# Patient Record
Sex: Female | Born: 1970 | Hispanic: No | Marital: Married | Smoking: Current every day smoker
Health system: Southern US, Community
[De-identification: ages and names within clinical notes are randomized; demographics above are authoritative.]

## PROBLEM LIST (undated history)

## (undated) DIAGNOSIS — G43909 Migraine, unspecified, not intractable, without status migrainosus: Secondary | ICD-10-CM

## (undated) DIAGNOSIS — M542 Cervicalgia: Secondary | ICD-10-CM

## (undated) DIAGNOSIS — I10 Essential (primary) hypertension: Secondary | ICD-10-CM

## (undated) DIAGNOSIS — F411 Generalized anxiety disorder: Secondary | ICD-10-CM

## (undated) HISTORY — DX: Essential (primary) hypertension: I10

## (undated) HISTORY — DX: Migraine, unspecified, not intractable, without status migrainosus: G43.909

## (undated) HISTORY — DX: Cervicalgia: M54.2

## (undated) HISTORY — DX: Generalized anxiety disorder: F41.1

---

## 2008-12-22 ENCOUNTER — Encounter: Payer: Self-pay | Admitting: Family

## 2009-02-20 ENCOUNTER — Encounter: Payer: Self-pay | Admitting: Family

## 2009-02-26 ENCOUNTER — Encounter: Payer: Self-pay | Admitting: Family

## 2009-02-27 ENCOUNTER — Encounter: Payer: Self-pay | Admitting: Family

## 2009-02-28 ENCOUNTER — Encounter: Payer: Self-pay | Admitting: Family

## 2009-03-04 ENCOUNTER — Encounter: Payer: Self-pay | Admitting: Family

## 2009-03-28 ENCOUNTER — Encounter: Payer: Self-pay | Admitting: Family Medicine

## 2009-04-04 ENCOUNTER — Encounter: Payer: Self-pay | Admitting: Family Medicine

## 2009-04-04 ENCOUNTER — Encounter: Payer: Self-pay | Admitting: Family

## 2009-04-08 ENCOUNTER — Encounter: Payer: Self-pay | Admitting: Family

## 2009-04-30 ENCOUNTER — Ambulatory Visit: Payer: Self-pay | Admitting: Family Medicine

## 2009-04-30 DIAGNOSIS — I10 Essential (primary) hypertension: Secondary | ICD-10-CM | POA: Insufficient documentation

## 2009-04-30 DIAGNOSIS — R1084 Generalized abdominal pain: Secondary | ICD-10-CM | POA: Insufficient documentation

## 2009-04-30 DIAGNOSIS — R002 Palpitations: Secondary | ICD-10-CM

## 2009-04-30 DIAGNOSIS — G43909 Migraine, unspecified, not intractable, without status migrainosus: Secondary | ICD-10-CM | POA: Insufficient documentation

## 2009-04-30 DIAGNOSIS — F411 Generalized anxiety disorder: Secondary | ICD-10-CM | POA: Insufficient documentation

## 2009-05-01 ENCOUNTER — Telehealth: Payer: Self-pay | Admitting: Family Medicine

## 2009-05-03 ENCOUNTER — Encounter: Payer: Self-pay | Admitting: Family Medicine

## 2009-05-07 ENCOUNTER — Telehealth: Payer: Self-pay | Admitting: Family Medicine

## 2009-05-15 ENCOUNTER — Ambulatory Visit: Payer: Self-pay | Admitting: Family Medicine

## 2009-05-15 DIAGNOSIS — M542 Cervicalgia: Secondary | ICD-10-CM

## 2009-05-17 ENCOUNTER — Telehealth: Payer: Self-pay | Admitting: Family Medicine

## 2009-05-20 ENCOUNTER — Encounter: Payer: Self-pay | Admitting: Family

## 2009-05-27 ENCOUNTER — Telehealth: Payer: Self-pay | Admitting: Family Medicine

## 2009-06-12 ENCOUNTER — Encounter: Payer: Self-pay | Admitting: Cardiology

## 2009-06-12 ENCOUNTER — Ambulatory Visit: Payer: Self-pay | Admitting: Cardiology

## 2009-06-12 DIAGNOSIS — R079 Chest pain, unspecified: Secondary | ICD-10-CM

## 2009-07-02 ENCOUNTER — Ambulatory Visit: Payer: Self-pay | Admitting: Diagnostic Radiology

## 2009-07-02 ENCOUNTER — Ambulatory Visit (HOSPITAL_COMMUNITY): Admission: RE | Admit: 2009-07-02 | Discharge: 2009-07-02 | Payer: Self-pay | Admitting: Cardiology

## 2009-07-02 ENCOUNTER — Emergency Department (HOSPITAL_BASED_OUTPATIENT_CLINIC_OR_DEPARTMENT_OTHER): Admission: EM | Admit: 2009-07-02 | Discharge: 2009-07-02 | Payer: Self-pay | Admitting: Emergency Medicine

## 2009-07-02 ENCOUNTER — Encounter: Payer: Self-pay | Admitting: Cardiology

## 2009-07-02 ENCOUNTER — Ambulatory Visit: Payer: Self-pay | Admitting: Family

## 2009-07-02 ENCOUNTER — Ambulatory Visit: Payer: Self-pay | Admitting: Cardiology

## 2009-07-02 ENCOUNTER — Ambulatory Visit: Payer: Self-pay

## 2009-07-03 ENCOUNTER — Telehealth: Payer: Self-pay | Admitting: Cardiology

## 2009-07-12 ENCOUNTER — Telehealth: Payer: Self-pay | Admitting: Cardiology

## 2009-07-16 ENCOUNTER — Encounter: Payer: Self-pay | Admitting: Cardiology

## 2009-07-16 ENCOUNTER — Ambulatory Visit: Payer: Self-pay | Admitting: Cardiology

## 2009-07-24 ENCOUNTER — Ambulatory Visit: Payer: Self-pay | Admitting: Family

## 2009-07-24 DIAGNOSIS — R5383 Other fatigue: Secondary | ICD-10-CM

## 2009-07-24 DIAGNOSIS — R5381 Other malaise: Secondary | ICD-10-CM

## 2009-07-29 ENCOUNTER — Telehealth: Payer: Self-pay | Admitting: Family

## 2009-07-29 DIAGNOSIS — IMO0002 Reserved for concepts with insufficient information to code with codable children: Secondary | ICD-10-CM

## 2009-07-29 DIAGNOSIS — D259 Leiomyoma of uterus, unspecified: Secondary | ICD-10-CM | POA: Insufficient documentation

## 2009-08-06 ENCOUNTER — Encounter: Payer: Self-pay | Admitting: Family

## 2009-08-06 ENCOUNTER — Telehealth: Payer: Self-pay | Admitting: Cardiology

## 2009-08-07 ENCOUNTER — Ambulatory Visit: Payer: Self-pay | Admitting: Family

## 2009-08-07 LAB — CONVERTED CEMR LAB
Bilirubin Urine: NEGATIVE
Nitrite: NEGATIVE
Specific Gravity, Urine: <1.005
Trich, Wet Prep: NONE SEEN
Urobilinogen, UA: 0.2
WBC Urine, dipstick: NEGATIVE
Yeast Wet Prep HPF POC: NONE SEEN
pH: 5

## 2009-08-08 ENCOUNTER — Telehealth: Payer: Self-pay | Admitting: Family

## 2009-08-09 ENCOUNTER — Encounter: Payer: Self-pay | Admitting: Family

## 2009-08-12 ENCOUNTER — Telehealth: Payer: Self-pay | Admitting: Family

## 2009-10-13 ENCOUNTER — Emergency Department (HOSPITAL_BASED_OUTPATIENT_CLINIC_OR_DEPARTMENT_OTHER): Admission: EM | Admit: 2009-10-13 | Discharge: 2009-10-13 | Payer: Self-pay | Admitting: Emergency Medicine

## 2009-10-13 ENCOUNTER — Ambulatory Visit: Payer: Self-pay | Admitting: Diagnostic Radiology

## 2009-10-16 ENCOUNTER — Ambulatory Visit: Payer: Self-pay | Admitting: Cardiology

## 2009-10-16 ENCOUNTER — Encounter: Payer: Self-pay | Admitting: Cardiology

## 2009-10-21 ENCOUNTER — Telehealth: Payer: Self-pay | Admitting: Family

## 2009-10-21 ENCOUNTER — Ambulatory Visit: Payer: Self-pay | Admitting: Family

## 2009-10-21 ENCOUNTER — Ambulatory Visit (HOSPITAL_BASED_OUTPATIENT_CLINIC_OR_DEPARTMENT_OTHER): Admission: RE | Admit: 2009-10-21 | Discharge: 2009-10-21 | Payer: Self-pay | Admitting: Internal Medicine

## 2009-10-21 ENCOUNTER — Ambulatory Visit: Payer: Self-pay | Admitting: Interventional Radiology

## 2009-10-21 DIAGNOSIS — R1013 Epigastric pain: Secondary | ICD-10-CM | POA: Insufficient documentation

## 2009-10-21 LAB — CONVERTED CEMR LAB
Albumin: 4.7 g/dL (ref 3.5–5.2)
Alkaline Phosphatase: 35 units/L — ABNORMAL LOW (ref 39–117)
Basophils Absolute: 0 10*3/uL (ref 0.0–0.1)
Basophils Relative: 1 % (ref 0–1)
Beta hcg, urine, semiquantitative: NEGATIVE
Eosinophils Absolute: 0.1 10*3/uL (ref 0.0–0.7)
Eosinophils Relative: 1 % (ref 0–5)
HCT: 39.1 % (ref 36.0–46.0)
Indirect Bilirubin: 0.5 mg/dL (ref 0.0–0.9)
Lipase: 18 units/L (ref 0–75)
Lymphocytes Relative: 38 % (ref 12–46)
Lymphs Abs: 2.3 10*3/uL (ref 0.7–4.0)
MCHC: 34.8 g/dL (ref 30.0–36.0)
MCV: 87.7 fL (ref 78.0–100.0)
Monocytes Relative: 6 % (ref 3–12)
Neutro Abs: 3.2 10*3/uL (ref 1.7–7.7)
Neutrophils Relative %: 54 % (ref 43–77)
Platelets: 191 10*3/uL (ref 150–400)
Total Protein: 7.1 g/dL (ref 6.0–8.3)

## 2009-10-22 ENCOUNTER — Telehealth: Payer: Self-pay | Admitting: Family

## 2009-10-24 ENCOUNTER — Ambulatory Visit: Payer: Self-pay | Admitting: Family

## 2009-10-24 ENCOUNTER — Telehealth: Payer: Self-pay | Admitting: Family

## 2009-10-25 ENCOUNTER — Encounter: Payer: Self-pay | Admitting: Family

## 2009-11-02 HISTORY — PX: MYOMECTOMY: SHX85

## 2009-11-16 ENCOUNTER — Encounter: Payer: Self-pay | Admitting: Family

## 2009-11-17 ENCOUNTER — Encounter: Payer: Self-pay | Admitting: Family

## 2009-11-19 ENCOUNTER — Telehealth: Payer: Self-pay | Admitting: Family

## 2009-11-20 ENCOUNTER — Ambulatory Visit: Payer: Self-pay | Admitting: Family

## 2009-11-20 DIAGNOSIS — IMO0001 Reserved for inherently not codable concepts without codable children: Secondary | ICD-10-CM

## 2009-11-22 ENCOUNTER — Telehealth: Payer: Self-pay | Admitting: Family

## 2009-11-22 ENCOUNTER — Encounter: Payer: Self-pay | Admitting: Family

## 2009-11-25 ENCOUNTER — Telehealth: Payer: Self-pay | Admitting: Family

## 2010-01-16 ENCOUNTER — Ambulatory Visit: Payer: Self-pay | Admitting: Family Medicine

## 2010-03-04 NOTE — Assessment & Plan Note (Signed)
Summary: NOV HAs/ GERD   Vital Signs:  Patient profile:   40 year old female Height:      63 inches Weight:      129 pounds BMI:     22.93 O2 Sat:      100 % on Room air Temp:     98.3 degrees F oral Pulse rate:   73 / minute BP sitting:   119 / 76  (left arm) Cuff size:   regular  Vitals Entered By: Payton Spark CMA (April 30, 2009 2:50 PM)  O2 Flow:  Room air CC: New to est. C/o stomach pain x weeks- has had all normal labs and scans recently. Dizzy all the time x 2 months and facial pressure and neck/thraot pain x few days.    Primary Care Provider:  Seymour Bars DO  CC:  New to est. C/o stomach pain x weeks- has had all normal labs and scans recently. Dizzy all the time x 2 months and facial pressure and neck/thraot pain x few days. Marland Kitchen  History of Present Illness: 40 yo [place] Female presents for feeling sick starting Jan 24th.  She was started on Lisinopril for HTN.  She had tongue swelling from it.  She then had heart palpitations with dizziness and presyncope at work.  She went to the ED and was diagnosed with allergic reaction to the Lisinopril.  Her EKG was normal.  She was given benadryl and steroid shots.  She went back to her regular doctor at [facility].  She had her labs done in Feb but she is not sure about her results.    She was started on  a different BP medication and reports having another reaction.  She had CXR and CT head and eveything was 'normal'.  She was diagnosed iwth anxiety.  She was put on Alprazolam but it made her sleepy.  She doesn't feel like it has helped.    Her abdomen hurts.  She had a normal CT and her gyn diagnosed her with fibroids.  She was put on strong pain meds that make her tired.    She took abx in Feb for ? H. Pylori but her pain did not improve.  She took a PrevPak.   She has been having HAs with neck pain and dizziness.  She has sharp L sided HA.  She has photophobia and nausea with HAs.      Current Medications (verified): 1)   Alprazolam 0.5 Mg Tabs (Alprazolam) .... Take 1 Tab By Mouth Three Times A Day  As Needed Anxiety 2)  Lisinopril 10 Mg Tabs (Lisinopril) .... Take 1 Tab By Mouth Once Daily  Allergies (verified): No Known Drug Allergies  Past History:  Past Medical History: anxiety HTN gyn Dr Silva Bandy  Past Surgical History: none  Family History: mother and father died in the War no sibblings  Social History: Moved from [place] in 2001. 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  Review of Systems       no fevers/sweats/weakness, unexplained wt loss/gain, no change in vision, no difficulty hearing, ringing in ears, no hay fever/allergies, no CP/discomfort, no palpitations, no breast lump/nipple discharge, no cough/wheeze, no blood in stool, no N/V/D, no nocturia, no leaking urine, no unusual vag bleeding, no vaginal/penile discharge, no muscle/joint pain, no rash, no new/changing mole, no HA, no memory loss, no anxiety, no sleep problem, no depression, no unexplained lumps, no easy bruising/bleeding, no concern with sexual function   Physical Exam  General:  alert, well-developed, well-nourished, and well-hydrated.   Head:  normocephalic and atraumatic.   Eyes:  sclera non icteric Nose:  no nasal discharge.   Mouth:  pharynx pink and moist.   Neck:  no masses.   Lungs:  Normal respiratory effort, chest expands symmetrically. Lungs are clear to auscultation, no crackles or wheezes. Heart:  Normal rate and regular rhythm. S1 and S2 normal without gallop, murmur, click, rub or other extra sounds. Abdomen:  epigastric TTP with vol guarding.   soft.  ND. no HSM. Extremities:  no LE edema Skin:  color normal.   Cervical Nodes:  No lymphadenopathy noted Psych:  good eye contact and slightly anxious.     Impression & Recommendations:  Problem # 1:  ABDOMINAL PAIN, GENERALIZED (ICD-789.07) Will refer to GI.  Will get a copy of her labs and scans that were already done.  Her  complaints and history are a bit confusing without records and will a language barrier.  She will probably need an EGD since she has failed to improve after Prevpak.    Start Dexilant samples today as her symptoms are somewhat c/w reflux esophagitis.   Orders: Gastroenterology Referral (GI)  Problem # 2:  MIGRAINE HEADACHE (ICD-346.90) With neck pain.  It sounds like she has some stress and anxiety that are inducing HAs which are in turn causing neck pain.  I will start her on Inderal LA for probable migraines which will also help reduce her BP and maybe even help w/ her anxiety.   Her updated medication list for this problem includes:    Inderal La 60 Mg Xr24h-cap (Propranolol hcl) .Marland Kitchen... 1 capsule by mouth once a day  Problem # 3:  ESSENTIAL HYPERTENSION, BENIGN (ICD-401.1) Stop Lisinopril.  It sounds like she had an angioedema response with tongue swelling iniitally and has felt bad ever since starting it.  Will change her to Inderal LA for BP and migraines.   Her updated medication list for this problem includes:    Inderal La 60 Mg Xr24h-cap (Propranolol hcl) .Marland Kitchen... 1 capsule by mouth once a day  BP today: 119/76  Problem # 4:  ANXIETY STATE, UNSPECIFIED (ICD-300.00) Continue Lorazepam for now.   Her updated medication list for this problem includes:    Lorazepam 0.5 Mg Tabs (Lorazepam) .Marland Kitchen... Take 1 tab by mouth three times a day  Complete Medication List: 1)  Lorazepam 0.5 Mg Tabs (Lorazepam) .... Take 1 tab by mouth three times a day 2)  Inderal La 60 Mg Xr24h-cap (Propranolol hcl) .Marland Kitchen.. 1 capsule by mouth once a day  Other Orders: T-TSH ([id number])  Patient Instructions: 1)  Stay OFF Lisinopril.  Call me with the names of other BP meds that were tried. 2)  I will review your records - labs, scans, etc. 3)  Start on Dexilant samples - 1 capsule daily for acid reflux/ possible ulcers and we will get you into GI to look at your stomache. 4)  F/U with me for dizziness, HAs and BP  in 2 wks. Prescriptions: INDERAL LA 60 MG XR24H-CAP (PROPRANOLOL HCL) 1 capsule by mouth once a day  #30 x 1   Entered by:   Payton Spark CMA   Authorized by:   Seymour Bars DO   Signed by:   Payton Spark CMA on 05/01/2009   Method used:   Electronically to        [facility] # [phone]* (retail)       [address]       [place], [place]  [address]       Ph: [phone]       Fax: [phone]   RxID:   [id number] INDERAL LA 60 MG XR24H-CAP (PROPRANOLOL HCL) 1 capsule by mouth once a day  #30 x 1   Entered and Authorized by:   Seymour Bars DO   Signed by:   Seymour Bars DO on 04/30/2009   Method used:   Electronically to        [facility] [phone]* (retail)       [address] [place], [place]  [address]       Ph: [phone]       Fax: [phone]   RxID:   [id number]

## 2010-03-04 NOTE — Progress Notes (Signed)
  Phone Note Outgoing Call   Call placed by: Lemont Fillers FNP,  October 21, 2009 5:06 PM Call placed to: Patient Summary of Call: Spoke with patient and husband on phone.  Reviewed ultrasound and blood work.  They will check their records and get back with me in AM with the name of her gastroenterologist.  Initial call taken by: Lemont Fillers FNP,  October 21, 2009 5:09 PM  Follow-up for Phone Call        Have not heard back from patient.  Last rx for nexium according to her pharmacy was from  Dr. Dorna Leitz from [facility].  Left message for pt that we will arrange f/u with Dr. Octaviano Glow.   Follow-up by: Lemont Fillers FNP,  October 22, 2009 2:37 PM

## 2010-03-04 NOTE — Progress Notes (Signed)
Summary: Meds   Phone Note Call from Patient   Summary of Call: Pt states she tried these meds: Verapamil 120mg  Metoprolol 25mg   Nifedibine 30mg   She is now taking lorazepam 0.5mg  three times a day now, not alprazolam. (I changed this in her chart) Pt also would like to know if she can take new Rx at night?  Please advise. Initial call taken by: Payton Spark CMA,  May 01, 2009 10:15 AM  Follow-up for Phone Call        will be OK to take the Inderal.   Follow-up by: Seymour Bars DO,  May 01, 2009 4:45 PM     Appended Document: Meds  Pt aware

## 2010-03-04 NOTE — Assessment & Plan Note (Signed)
Summary: hosp f/u /tf,cma--Rm 4   Vital Signs:  Patient profile:   40 year old female Height:      63 inches Weight:      136.25 pounds BMI:     24.22 Temp:     97.7 degrees F oral Pulse rate:   84 / minute Pulse rhythm:   regular Resp:     22 per minute BP sitting:   132 / 90  (right arm) Cuff size:   regular  Vitals Entered By: Mervin Kung CMA Duncan Dull) (November 20, 2009 3:50 PM) CC: Rm 4  Hospital f/u. Is Patient Diabetic? No Comments Pt has completed Clarithromycin. Pt has added Tylenol otc 1 four times a day. Nicki Guadalajara Fergerson CMA Duncan Dull)  November 20, 2009 3:55 PM    Primary Care Provider:  Lemont Fillers FNP  CC:  [facility] f/u.Marland Kitchen  History of Present Illness: Ms Arlan Organ is a  40 year old female who presents for follow up. She was evaluated at [facility] on 10/15 and admitted overnight for atypical chest pain.  Records are available today for review.  Discharge summary notes that pain was felt likely noncardiac and probably musculoskeletal.  In addition to normal cardiac markers, she also underwent a CT of the brain/sinus/abdomen and pelvis which were all unremarkable. She had a normal chest x-ray.  She is scheduled for a stress test.  Today she reports ongoing  HA/dizziness, tigntness in her throat, sense of SOB, pain beneath her ribs.  Had fibroid surgery on 10/6.    Patient has history of multiple somatic complaints including HA, generalized abdominal pain, muscle pain, palpitations.  See previous notes for additional information.   Allergies: 1)  ! Inderal 2)  ! * Nifedipine 3)  ! Lisinopril 4)  ! Hydrochlorothiazide 5)  ! Propranolol Hcl 6)  ! Metoprolol Succinate 7)  ! Verapamil 8)  ! * Topiramate 9)  ! * Metronidazole 10)  ! * Tetracycline 11)  ! * Amlodipine 12)  ! Prilosec (Omeprazole) 13)  ! Amoxicillin 14)  ! Hydrocodone 15)  ! Levaquin 16)  ! * Vicoprofen 17)  ! * Omeprazole  Past History:  Past Medical History: Reviewed history from  06/12/2009 and no changes required. ESSENTIAL HYPERTENSION, BENIGN (ICD-401.1) CERVICALGIA (ICD-723.1) ANXIETY STATE, UNSPECIFIED (ICD-300.00) MIGRAINE HEADACHE (ICD-346.90) gyn Dr Silva Bandy  Past Surgical History: myomectomy 10/11  Review of Systems       see HPI  Physical Exam  General:  White female, awake, alert, uncomfortable appearing, in NAD.  Appears older than stated age. Head:  Normocephalic and atraumatic without obvious abnormalities. No apparent alopecia or balding. Neck:  No deformities, masses, or tenderness noted. Lungs:  Normal respiratory effort, chest expands symmetrically. Lungs are clear to auscultation, no crackles or wheezes. Heart:  Normal rate and regular rhythm. S1 and S2 normal without gallop, murmur, click, rub or other extra sounds. Abdomen:  Lower abdominal incision well approximated without drainage or erythema.  Mild lower abdominal tenderness to palpation.   Psych:  Oriented X3, memory intact for recent and remote, and normally interactive.  Flat affect, appears mildly anxious   Impression & Recommendations:  Problem # 1:  ANXIETY STATE, UNSPECIFIED (ICD-300.00) Assessment Comment Only 40 year old female with multiple somatic complaints.  She has previously undergone tenderpoint testing on previous visit and was tender at each site.  I have discussed that her symptoms are most likely due to fibromyalgia along with some anxiety and maybe depression.  She has  had a very extensive work up including GI evaluation which included endoscopy which was reportedly negative except for finding of H. Pylori.  Will try to get these records.  Will give patient a trial of cymbalta to hopefully help with her fibromyalgia as well as anxiety/depression.  I am not certain that she will stick with the medication, however as she his history of multiple drug intolerances in the past.  I discussed the common side effects of cymbalta including risk or worseing depression/suicide  ideation.  Should she develop any worsening depression she should discontinue the medication and call us immediately or go to the ED. She verbalizes understanding. 25 minutes were spent with the patient and her husband.  Greater tha 50% of this time was spent counseling the patient on her anxiety/depression. Her updated medication list for this problem includes:    Lorazepam 0.5 Mg Tabs (Lorazepam) .Marland Kitchen... Take 1 tablet by mouth four times a day    Cymbalta 60 Mg Cpep (Duloxetine hcl) ..... One tablet by mouth once daily  Problem # 2:  FIBROMYALGIA (ICD-729.1) Assessment: Unchanged See #1 for discussion Her updated medication list for this problem includes:    Tylenol Extra Strength 500 Mg Tabs (Acetaminophen) .Marland Kitchen... 1 tablet four times daily  Complete Medication List: 1)  Lorazepam 0.5 Mg Tabs (Lorazepam) .... Take 1 tablet by mouth four times a day 2)  Nexium 40 Mg Pack (Esomeprazole magnesium) .... One tablet by mouth daily 3)  Clarithromycin 500 Mg Tabs (Clarithromycin) .... One tablet by mouth two times a day x 10 days 4)  Tylenol Extra Strength 500 Mg Tabs (Acetaminophen) .Marland Kitchen.. 1 tablet four times daily 5)  Cymbalta 60 Mg Cpep (Duloxetine hcl) .... One tablet by mouth once daily  Patient Instructions: 1)  Please start cymbalta 30mg  once daily for 1 week, then increase to 60mg  by mouth once daily as tolerated. 2)  Please schedule a follow-up appointment in 1 month.  Prescriptions: CYMBALTA 60 MG CPEP (DULOXETINE HCL) one tablet by mouth once daily  #30 x 0   Entered and Authorized by:   Lemont Fillers FNP   Signed by:   Lemont Fillers FNP on 11/20/2009   Method used:   Print then Give to Patient   RxID:   [id number]    Orders Added: 1)  Est. Patient Level IV [10175]     Current Allergies (reviewed today): ! INDERAL ! * NIFEDIPINE ! LISINOPRIL ! HYDROCHLOROTHIAZIDE ! PROPRANOLOL HCL ! METOPROLOL SUCCINATE ! VERAPAMIL ! * TOPIRAMATE ! * METRONIDAZOLE ! *  TETRACYCLINE ! * AMLODIPINE ! PRILOSEC (OMEPRAZOLE) ! AMOXICILLIN ! HYDROCODONE ! LEVAQUIN ! * VICOPROFEN ! * OMEPRAZOLE

## 2010-03-04 NOTE — Progress Notes (Signed)
Summary: Inderal SEs  Phone Note Call from Patient   Caller: Patient Summary of Call: Pt LMOM stating the Inderal is cauing HAs, worse dizziness and nausea. She wants to know if she should continue med? Initial call taken by: Payton Spark CMA,  May 07, 2009 2:11 PM  Follow-up for Phone Call        stop the inderal.  Set up OV next wk for f/u HAs (no sooner than a wk).   Follow-up by: Seymour Bars DO,  May 07, 2009 2:20 PM   New Allergies: ! INDERAL New Allergies: ! INDERAL  Appended Document: Inderal SEs LMOM for Pt to CB.Arvilla Market CMA, Michelle May 07, 2009 2:31 PM   Pt aware.Arvilla Market CMA, Michelle May 07, 2009 3:31 PM

## 2010-03-04 NOTE — Letter (Signed)
Summary: [facility]   Imported By: Lanelle Bal 08/19/2009 10:46:03  _____________________________________________________________________  External Attachment:    Type:   Image     Comment:   External Document

## 2010-03-04 NOTE — Assessment & Plan Note (Signed)
Summary: 1 month follow up/mjhf   Vital Signs:  Patient profile:   40 year old female Height:      63 inches Weight:      139 pounds BMI:     24.71 O2 Sat:      99 % on Room air Temp:     98.5 degrees F oral Pulse rate:   80 / minute Pulse rhythm:   regular Resp:     16 per minute BP sitting:   114 / 80  (right arm) Cuff size:   regular  Vitals Entered By: Glendell Docker CMA (August 07, 2009 10:33 AM)  O2 Flow:  Room air CC: Rm 5 1 Month Follow up  Comments c/o chest pain with deep breathing, lower abdomen discomfort unresolved   Primary Care Provider:  Seymour Bars DO  CC:  Rm 5 1 Month Follow up .  History of Present Illness: Natalie Cooley is a 40 year old female who presents today with complaint of muscle pain.  She also continues to complain of pelvic pain.  She provided [place] with records from her GYN which indicate that she has had negative imaging of her pelvis and abdomen this past spring.  She also complains of some vaginal itching and pleuritic chest pain.  Her chest pain has been evaluted by cardiology with negative work up.  She has also undergone extensive GI evaluation which has included treatment for H Pylori (for which patient has been intolerant to antibiotic treatment).    Preventive Screening-Counseling & Management  Alcohol-Tobacco     Smoking Status: current  Allergies: 1)  ! Inderal 2)  ! * Nifedipine 3)  ! Lisinopril 4)  ! Hydrochlorothiazide 5)  ! Propranolol Hcl 6)  ! Metoprolol Succinate 7)  ! Verapamil 8)  ! * Topiramate 9)  ! * Metronidazole 10)  ! * Tetracycline 11)  ! * Amlodipine 12)  ! Prilosec (Omeprazole)  Past History:  Past Medical History: Last updated: Jun 26, 2009 ESSENTIAL HYPERTENSION, BENIGN (ICD-401.1) CERVICALGIA (ICD-723.1) ANXIETY STATE, UNSPECIFIED (ICD-300.00) MIGRAINE HEADACHE (ICD-346.90) gyn Dr Silva Bandy  Past Surgical History: Last updated: 04/30/2009 none  Family History: Last updated: Jun 26, 2009 mother and  father died in the War no sibblings No premature coronary artery disease  Social History: Last updated: 06-26-2009 Moved from [place] in 2001. 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 Tobacco Use - Yes.  Alcohol Use - no Drug Use - no  Risk Factors: Smoking Status: current (08/07/2009)  Physical Exam  General:  Well-developed,well-nourished,in no acute distress; alert,appropriate and cooperative throughout examination Head:  Normocephalic and atraumatic without obvious abnormalities. No apparent alopecia or balding. Neck:  No deformities, masses, or tenderness noted. Lungs:  Normal respiratory effort, chest expands symmetrically. Lungs are clear to auscultation, no crackles or wheezes. Heart:  Normal rate and regular rhythm. S1 and S2 normal without gallop, murmur, click, rub or other extra sounds. Genitalia:  Pelvic Exam:        External: normal female genitalia without lesions or masses        Vagina: normal without lesions or masses        Cervix: normal without lesions or masses        Adnexa: normal bimanual exam without masses or fullness.  Some bilateral adnexal tenderness to palpation        Uterus: normal by palpation        Pap smear: performed Psych:  Oriented X3 and slightly anxious.     Impression & Recommendations:  Problem # 1:  MYALGIA (ICD-729.1) Assessment Unchanged Will check below labs.  If negative, will plan to initiate savella for treatment of fibromyalgia given that patient was tender at all tender points last exam.   Orders: T-ANA ([id number]) T-Rheumatoid Factor [phone]) T-ESR [facility]) [phone])  Problem # 2:  BACTERIAL VAGINITIS (ICD-616.10) Assessment: New Wet prep- + for BV.  Will treat with cleocin cream as pt is allergic to metronidazole. Gc/Chlamydia also sent due to patients c/o of pelvic pain.   Her updated medication list for this problem includes:    Cleocin 2 % Crea (Clindamycin phosphate) .Marland KitchenMarland KitchenMarland KitchenMarland Kitchen 5 grams  intravaginally at bedtime x 7 days  Complete Medication List: 1)  Lorazepam 0.5 Mg Tabs (Lorazepam) .... Take 1 tablet by mouth four times a day 2)  Nexium 40 Mg Cpdr (Esomeprazole magnesium) .... One cap by mouth daily 3)  Diazepam 5 Mg Tabs (Diazepam) .... 1/2 tablet by mouth two times a day as needed for severe anxiety 4)  Cleocin 2 % Crea (Clindamycin phosphate) .... 5 grams intravaginally at bedtime x 7 days  Other Orders: UA Dipstick w/o Micro (manual) ([id number]) T-Culture, GC Screen ([id number]) T-Wet Prep by Molecular Probe [phone]) Specimen Handling ([id number])  Patient Instructions: 1)  Please complete the lab work downstairs. 2)  Call if symptoms worsen or do not improve.    Current Allergies (reviewed today): ! INDERAL ! * NIFEDIPINE ! LISINOPRIL ! HYDROCHLOROTHIAZIDE ! PROPRANOLOL HCL ! METOPROLOL SUCCINATE ! VERAPAMIL ! * TOPIRAMATE ! * METRONIDAZOLE ! * TETRACYCLINE ! * AMLODIPINE ! PRILOSEC (OMEPRAZOLE) Laboratory Results   Urine Tests    Routine Urinalysis   Color: yellow Appearance: Clear Glucose: negative   (Normal Range: Negative) Bilirubin: negative   (Normal Range: Negative) Ketone: negative   (Normal Range: Negative) Spec. Gravity: <1.005   (Normal Range: 1.003-1.035) Blood: negative   (Normal Range: Negative) pH: 5.0   (Normal Range: 5.0-8.0) Protein: negative   (Normal Range: Negative) Urobilinogen: 0.2   (Normal Range: 0-1) Nitrite: negative   (Normal Range: Negative) Leukocyte Esterace: negative   (Normal Range: Negative)

## 2010-03-04 NOTE — Letter (Signed)
Summary: [facility]   Imported By: Lanelle Bal 08/19/2009 10:49:12  _____________________________________________________________________  External Attachment:    Type:   Image     Comment:   External Document

## 2010-03-04 NOTE — Assessment & Plan Note (Signed)
Summary: pt requesting u/s for pain under ribs/dt--rm 4   Vital Signs:  Patient profile:   40 year old female Height:      63 inches Weight:      138.25 pounds BMI:     24.58 Temp:     97.9 degrees F oral Pulse rate:   78 / minute Pulse rhythm:   regular Resp:     12 per minute BP sitting:   132 / 80  (right arm) Cuff size:   regular  Vitals Entered By: Mervin Kung CMA Duncan Dull) (October 21, 2009 11:15 AM) CC: Rm 4  Abdominal pain since Friday. Had Diarrhea Friday and Saturday. Wants gallbladder and sinuses checked. Is Patient Diabetic? No Pain Assessment Patient in pain? yes     Location: abdomen Intensity: 8 Type: sharp, burning Onset of pain  last Friday   Primary Care Provider:  Seymour Bars DO  CC:  Rm 4  Abdominal pain since Friday. Had Diarrhea Friday and Saturday. Wants gallbladder and sinuses checked.Marland Kitchen  History of Present Illness: Ms Gajda is a 40 year old female who presents with c/o of epigastric pain x 2 weeks.  Pain is worse after eating- and was associated with diarrhea.  Notes that she had diarrhea on friday and saturday followed by constipation the last 2 days.  Stools have been dark in color yesterday (normal brown color today)- no BRBPR.  Denies nausa or vomitting.  Also has complaint of "sour taste in her throat." She is accompanied by her husband.  Sinuses notes that she has chronic frontal sinus pain.  Notes + associated sinus tenderness.  Some post nasal drip. Denies fever.    Muscle pain- unchanged.  She never started the [place], and is nervous about starting this medication at this time due to the side effects.  Allergies: 1)  ! Inderal 2)  ! * Nifedipine 3)  ! Lisinopril 4)  ! Hydrochlorothiazide 5)  ! Propranolol Hcl 6)  ! Metoprolol Succinate 7)  ! Verapamil 8)  ! * Topiramate 9)  ! * Metronidazole 10)  ! * Tetracycline 11)  ! * Amlodipine 12)  ! Prilosec (Omeprazole) 13)  ! Amoxicillin 14)  ! Hydrocodone 15)  ! Levaquin 16)  !  * Vicoprofen 17)  ! * Omeprazole  Physical Exam  General:  Tired appearing female, awake, alert and in NAD Head:  Normocephalic and atraumatic without obvious abnormalities. No apparent alopecia or balding. + maxillary tenderness to palpation. Ears:  External ear exam shows no significant lesions or deformities.  Otoscopic examination reveals clear canals, tympanic membranes are intact bilaterally without bulging, retraction, inflammation or discharge. Hearing is grossly normal bilaterally. Mouth:  Oral mucosa and oropharynx without lesions or exudates.  Teeth in good repair. Neck:  No deformities, masses, or tenderness noted. Lungs:  Normal respiratory effort, chest expands symmetrically. Lungs are clear to auscultation, no crackles or wheezes. Heart:  Normal rate and regular rhythm. S1 and S2 normal without gallop, murmur, click, rub or other extra sounds. Abdomen:  + epigastric tenderness to palpation.  +RUQ/LUQ tenderness to palpation-  + BS, abdomen is soft and non-distended.     Impression & Recommendations:  Problem # 1:  EPIGASTRIC PAIN (ICD-789.06) Assessment New Differential includes gastroenteritis, ulcer, pancreatitis, cholecystitis.  Patient will be returned to nexium based on her complaints of "sour taste" in throat which is consistent with GERD.  She was also instructed to discontinue use of NSAIDS, and substitute as needed tylenol instead. Ordered CT abdomen  and pelvis, however her insurer denies- recommended ultrasound first.   Will obtain ultrasound and obtain below listed labs. Orders: TLB-CBC Platelet - w/Differential (85025-[id number]) TLB-Hepatic/Liver Function Pnl ([id number]-[id number]) T-Lipase ([id number]) T-Amylase ([id number]) Misc. Referral (Misc. Ref) Ultrasound (Ultrasound)  Problem # 2:  SINUSITIS (ICD-473.9) Assessment: New Will plan to treat with doxyclycline.   Pt has multiple drug intolerances- choices are limited.  Her updated medication list for this problem  includes:    Doxycycline Hyclate 100 Mg Caps (Doxycycline hyclate) ..... One cap by mouth two times a day x 10 days  Problem # 3:  MYALGIA (ICD-729.1) Assessment: Unchanged Pt will consider trial of Savella.  I am suspiscous that she has fibromyalgia.  The following medications were removed from the medication list:    Ibuprofen 800 Mg Tabs (Ibuprofen) .Marland Kitchen... Take 1 tablet by mouth three times a day  Complete Medication List: 1)  Lorazepam 0.5 Mg Tabs (Lorazepam) .... Take 1 tablet by mouth four times a day 2)  Nexium 40 Mg Pack (Esomeprazole magnesium) .... One tablet by mouth daily 3)  Doxycycline Hyclate 100 Mg Caps (Doxycycline hyclate) .... One cap by mouth two times a day x 10 days  Other Orders: Urine Pregnancy Test  ([id number])   Patient Instructions: 1)  Please complete the blood work and CT scan downstairs today. 2)  Follow up in 1 week. 3)  Go to ER if you develop worsening abdominal pain. 4)  Follow up here in 1 weeks. Prescriptions: DOXYCYCLINE HYCLATE 100 MG CAPS (DOXYCYCLINE HYCLATE) one cap by mouth two times a day x 10 days  #20 x 0   Entered and Authorized by:   Lemont Fillers FNP   Signed by:   Lemont Fillers FNP on 10/21/2009   Method used:   Electronically to        [facility] # [phone]* (retail)       [address]       [place], [place]  [address]       Ph: [phone]       Fax: [phone]   RxID:   [id number] NEXIUM 40 MG PACK (ESOMEPRAZOLE MAGNESIUM) one tablet by mouth daily Brand medically necessary #30 x 5   Entered and Authorized by:   Lemont Fillers FNP   Signed by:   Lemont Fillers FNP on 10/21/2009   Method used:   Electronically to        [facility] # [phone]* (retail)       [address]       [place], [place]  [address]       Ph: [phone]       Fax: [phone]   RxID:   [id number]   Current Allergies (reviewed today): ! INDERAL ! *  NIFEDIPINE ! LISINOPRIL ! HYDROCHLOROTHIAZIDE ! PROPRANOLOL HCL ! METOPROLOL SUCCINATE ! VERAPAMIL ! * TOPIRAMATE ! * METRONIDAZOLE ! * TETRACYCLINE ! * AMLODIPINE ! PRILOSEC (OMEPRAZOLE) ! AMOXICILLIN ! HYDROCODONE ! LEVAQUIN ! * VICOPROFEN ! * OMEPRAZOLE  Laboratory Results   Urine Tests      Urine HCG: negative

## 2010-03-04 NOTE — Progress Notes (Signed)
----   Converted from flag ---- ---- 11/25/2009 9:28 AM, Mervin Kung CMA (AAMA) wrote: Sherron Monday to [place] @ [phone] and she states records will be faxed tomorrow.  ---- 11/25/2009 9:07 AM, Lemont Fillers FNP wrote: Could you pls call [facility] and request patient's EGD results? ------------------------------

## 2010-03-04 NOTE — Letter (Signed)
Summary: [facility]   Imported By: Lanelle Bal 12/09/2009 09:39:38  _____________________________________________________________________  External Attachment:    Type:   Image     Comment:   External Document
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
## 2010-03-04 NOTE — Progress Notes (Signed)
Summary: fibromyalgia med delay  Phone Note Outgoing Call   Summary of Call: Please call patient and let her know that her lab work is negative.  I would like fro her to try Savella for Fibromyalgia- I will leave rx for the 2 week titration pack.  She should take this as directed, then start 50mg  by mouth two times a day when she completes the titration pack.  I have sent the 50mg  tabs to her pharmacy.  She should follow up in 1 month. Initial call taken by: Lemont Fillers FNP,  August 12, 2009 8:18 AM  Follow-up for Phone Call        Notified pt per [facility] instructions. Pt states she is leaving in 2 days to return to [place] and will be returning on 09/12/09. Pt will pick up sample today. She states she will start sample when she returns as she is concerned about history of medication reactions.  Nicki Guadalajara Fergerson CMA (AAMA)  August 12, 2009 12:10 PM     New/Updated Medications: SAVELLA 50 MG TABS (MILNACIPRAN HCL) one tab by mouth two times a day Prescriptions: SAVELLA 50 MG TABS (MILNACIPRAN HCL) one tab by mouth two times a day  #60 x 1   Entered and Authorized by:   Lemont Fillers FNP   Signed by:   Lemont Fillers FNP on 08/12/2009   Method used:   Electronically to        [facility] # [phone]* (retail)       [address]       [place], [place]  [address]       Ph: [phone]       Fax: [phone]   RxID:   [id number]

## 2010-03-04 NOTE — Progress Notes (Signed)
Summary: Card referral  Phone Note Call from Patient   Caller: Patient Summary of Call: Pt called stating she is ready to see a cardiologist and would like referral to be made.  Initial call taken by: Payton Spark CMA,  May 27, 2009 11:41 AM  Follow-up for Phone Call        done. Follow-up by: Seymour Bars DO,  May 27, 2009 11:58 AM

## 2010-03-04 NOTE — [facility] (Signed)
[facility]   Im[facility] By: Lanelle Bal 08/19/2009 10:48:00  _____________________________________________________________________  External Attachment:    Type:   Image     Comment:   External Document

## 2010-03-04 NOTE — Progress Notes (Signed)
Summary: SE HCTZ  Phone Note Call from Patient   Caller: Patient Summary of Call: Pt states she started the HCTZ yesterday and now she is having increased dizziness and HA. She also c/o elevated BP. Please advise. Initial call taken by: Payton Spark CMA,  May 17, 2009 1:48 PM  Follow-up for Phone Call        This is a common SE from the medication.  Start with 1/2 tab daily until body gets used to lower BPs and SEs improve.  Do not stop it.   Follow-up by: Seymour Bars DO,  May 17, 2009 1:59 PM  Additional Follow-up for Phone Call Additional follow up Details #1::        Pt states she doesn't want to continue med bc it is raising her blood pressure. BP today after med 153/90. She also states that it makes her feel like her throat is "squeezing". Please advise. Additional Follow-up by: Payton Spark CMA,  May 17, 2009 2:03 PM    Additional Follow-up for Phone Call Additional follow up Details #2::    she is out of options. If she is not going to comply with treatment, offer her to see cardiology. Follow-up by: Seymour Bars DO,  May 17, 2009 2:07 PM   Appended Document: SE HCTZ Pt states she doesn't know what she needs and doesn't understand what is going on w/ her. Pt states she would like to continue to check her BP from home for 1 week and she will CB w/ readings and also to let you know if she wants to see cards.

## 2010-03-04 NOTE — Assessment & Plan Note (Signed)
Summary: [facility]   Visit Type:  Follow-up Primary Provider:  Seymour Bars DO  CC:  chest painS.  History of Present Illness: 40 yo female that I have seen in the past for chest pain, palpitations and hypertension. The patient is from [place]. She apparently was diagnosed with hypertension in fall of 2010l. She has been tried on multiple medications. She has been intolerant to Inderal, nifedipine, lisinopril, HCTZ, propranolol, metoprolol, verapamil. She states most of these medications caused a headache. Lisinopril caused tongue swelling. An echocardiogram in May of 2011 showed normal LV function. A Holter monitor revealed sinus rhythm with occasional PACs. There appeared to be significant artifact in some of the monitor. When I saw her previously and we added Toprol but this apparently caused shortness of breath. Instead Norvasc was added. Since then she discontinued her Norvasc after 2 doses because she stated it caused headache, dizziness and extreme fatigue. She was seen in the emergency room on Jul 02, 2009 with chest pain. The pain had been continuous for 4 days and increased with certain movements and palpation. Chest x-ray showed no infiltrate. A CBC, met, d-dimer and one set of cardiac markers were negative. I last saw her in July of 2011. Since then, she has multiple complaints. She continues to have vague chest pain that is continuous without ever completely resolving. She continues to have left upper and pain. There is no dyspnea. She continues to have occasional palpitations. They're not sustained. She also apparently has been diagnosed with fibroids which will require surgery.  Allergies: 1)  ! Inderal 2)  ! * Nifedipine 3)  ! Lisinopril 4)  ! Hydrochlorothiazide 5)  ! Propranolol Hcl 6)  ! Metoprolol Succinate 7)  ! Verapamil 8)  ! * Topiramate 9)  ! * Metronidazole 10)  ! * Tetracycline 11)  ! * Amlodipine 12)  ! Prilosec (Omeprazole) 13)  ! Amoxicillin 14)  !  Hydrocodone 15)  ! Levaquin 16)  ! * Vicoprofen 17)  ! * Omeprazole  Past History:  Past Medical History: Reviewed history from 06/12/2009 and no changes required. ESSENTIAL HYPERTENSION, BENIGN (ICD-401.1) CERVICALGIA (ICD-723.1) ANXIETY STATE, UNSPECIFIED (ICD-300.00) MIGRAINE HEADACHE (ICD-346.90) gyn Dr Silva Bandy  Past Surgical History: Reviewed history from 04/30/2009 and no changes required. none  Social History: Reviewed history from 06/12/2009 and no changes required. Moved from [place] in 2001. 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 Tobacco Use - Yes.  Alcohol Use - no Drug Use - no  Review of Systems       Abdominal pain and fibroids but no fevers or chills, productive cough, hemoptysis, dysphasia, odynophagia, melena, hematochezia, dysuria, hematuria, rash, seizure activity, orthopnea, PND, pedal edema, claudication. Remaining systems are negative.   Vital Signs:  Patient profile:   40 year old female Height:      63 inches Weight:      137 pounds BMI:     24.36 Pulse rate:   75 / minute Pulse rhythm:   regular Resp:     18 per minute BP sitting:   124 / 94  (right arm) Cuff size:   large  Vitals Entered By: Vikki Ports (October 16, 2009 1:47 PM)  Physical Exam  General:  Well-developed well-nourished in no acute distress.  Skin is warm and dry.  HEENT is normal.  Neck is supple. No thyromegaly.  Chest is clear to auscultation with normal expansion.  Cardiovascular exam is regular rate and rhythm.  Abdominal exam nontender or distended. No masses  palpated. Extremities show no edema. neuro grossly intact    EKG  Procedure date:  10/16/2009  Findings:      Normal sinus rhythm at a rate of 69. Incomplete right bundle branch block. No ST changes.  Impression & Recommendations:  Problem # 1:  CHEST PAIN (ICD-786.50) Symptoms atypical and not consistent with cardiac pain. No further indicated.  Problem # 2:  ESSENTIAL  HYPERTENSION, BENIGN (ICD-401.1) I reviewed her blood pressures from home readings. Her systolic typically is in the 120 to 130 range. Her diastolic is typically 85. We will not add additional medications as she has not tolerated multiple medications previously and her blood pressure appears to be reasonably well controlled on lifestyle modification only.  Problem # 3:  ANXIETY STATE, UNSPECIFIED (ICD-300.00)

## 2010-03-04 NOTE — Progress Notes (Signed)
Summary: med reaction; lab order  Phone Note Outgoing Call   Summary of Call: Pls contact patient and let her know that I have reviewed her medical records.  I would like to have her return for the following labs which have not yet been checked:  B12, folate, ESR, RA, ANA (ICD-9 729.1)  Initial call taken by: Lemont Fillers FNP,  July 29, 2009 8:18 AM  Follow-up for Phone Call        Left message on machine to return my call.  Mervin Kung CMA  July 29, 2009 9:29 AM   Additional Follow-up for Phone Call Additional follow up Details #1::        Spoke with pt. and she states that she developed rash on stomach and legs after taking Levaquin. Also having stomach pain. She has stopped Levaquin. Please advise.  Mervin Kung CMA  July 30, 2009 8:33 AM   New Problems: FIBROIDS, UTERUS (ICD-218.9) DEGENERATIVE DISC DISEASE (ICD-722.6) MYALGIA (ICD-729.1)   Additional Follow-up for Phone Call Additional follow up Details #2::    Spoke to Ms Frew- she completed 3 days of Levaquin.  Denies dysuria.   Should be adequate treatment for uncomplicated UTI. Now complaining of GI upset, cramping, and pain which she attributes to Levaquin.  She has notified her Gastroenterologist of rash and discontinuation of Levaquin.  Recommended to patient to call if abdominal pain worsens or does not improve in the next 24 hours. Follow-up by: Lemont Fillers FNP,  July 30, 2009 9:04 AM  New Problems: FIBROIDS, UTERUS (ICD-218.9) DEGENERATIVE DISC DISEASE (ICD-722.6) MYALGIA (ICD-729.1)

## 2010-03-04 NOTE — Assessment & Plan Note (Signed)
Summary: NOT FEELING WELL--UTI//VGJ--Rm 4   Vital Signs:  Patient profile:   40 year old female Height:      63 inches Weight:      141.25 pounds BMI:     25.11 Temp:     98.1 degrees F oral Pulse rate:   78 / minute Pulse rhythm:   regular Resp:     18 per minute BP sitting:   124 / 72  (right arm) Cuff size:   regular  Vitals Entered By: Mervin Kung CMA (July 24, 2009 2:18 PM) CC: Room 4  Pt here to discuss treatment for UTI per Dr. Octaviano Glow; Amoxicillin rx was given to pt.  Pt wants to discuss allergy testing; multiple drug allergies, feels tired all the time. Is Patient Diabetic? No Comments Pt not taking coral calcium.  Currently added:  Women's One A Day vit and Amoxicillin 500mg  1 twice daily.   Primary Care Provider:  Seymour Bars DO  CC:  Room 4  Pt here to discuss treatment for UTI per Dr. Octaviano Glow; Amoxicillin rx was given to pt.  Pt wants to discuss allergy testing; multiple drug allergies and feels tired all the time.Marland Kitchen  History of Present Illness: Mr Bacot is a 40 year old female who presents today to discuss treatment of her UTI.  Yesterday she was seen by her gastroenterologist (Dr. Octaviano Glow of GI) who noted + UA and recommend that she present for follow up.  She is about to resume treatment for H Pylori which includes Levaquin, amoxicillin and nexium.   She also has some generalized complaints today which include frequent  + HA,  + pain in her arms.  Describes pain as "electricity like ants." Denies numbness in her feet.  Appetite is good.  Feels tired.  Allergies: 1)  ! Inderal 2)  ! * Nifedipine 3)  ! Lisinopril 4)  ! Hydrochlorothiazide 5)  ! Propranolol Hcl 6)  ! Metoprolol Succinate 7)  ! Verapamil 8)  ! * Topiramate 9)  ! * Metronidazole 10)  ! * Tetracycline 11)  ! * Amlodipine 12)  ! Prilosec (Omeprazole)  Physical Exam  General:  Well-developed,well-nourished,in no acute distress; alert,appropriate and cooperative throughout  examination Lungs:  Normal respiratory effort, chest expands symmetrically. Lungs are clear to auscultation, no crackles or wheezes. Heart:  Normal rate and regular rhythm. S1 and S2 normal without gallop, murmur, click, rub or other extra sounds. Msk:  Pain in 18/18 tender point locations(for FM)   Impression & Recommendations:  Problem # 1:  UTI (ICD-599.0) Assessment New Plan for patient to start Levquin as prescribed by GI.  Will send urine for culture. Her updated medication list for this problem includes:    Levaquin 500 Mg Tabs (Levofloxacin) ..... One tab by mouth twice daily    Amoxicillin 500 Mg Caps (Amoxicillin) ..... One tablet by mouth two times a day  Orders: Specimen Handling (04540) T-Culture, Urine ([id number])  Problem # 2:  FATIGUE (ICD-780.79) Assessment: Comment Only Patient also with complaint of muscle pain.  She is noted to be tender in 18 points.  TSH normal in March.  Reports that she has had extensive blood work and will bring these results to [place] tomorrow.   Would like to r/o autoimmune etiology or B12/folte deficiency due to what sounds like paresthesias.  If work up negative, would consider rx for fibromyalgia.    Complete Medication List: 1)  Lorazepam 0.5 Mg Tabs (Lorazepam) .... Take 1 tab by mouth three times  a day 2)  Coral Calcium Plus Caps (Multiple vitamins-minerals) .... Take 1 capsule by mouth two times a day 3)  Levaquin 500 Mg Tabs (Levofloxacin) .... One tab by mouth twice daily 4)  Nexium 40 Mg Cpdr (Esomeprazole magnesium) .... One cap by mouth daily 5)  Claritin 10 Mg Tabs (Loratadine) .... One tablet by mouth daily 6)  Amoxicillin 500 Mg Caps (Amoxicillin) .... One tablet by mouth two times a day  Patient Instructions: 1)  Please start levaquin. 2)  Bring by your paperwork, I will review and see if we need to add on any further labs. 3)  Please follow up in 1 month.  Current Allergies (reviewed today): ! INDERAL ! * NIFEDIPINE !  LISINOPRIL ! HYDROCHLOROTHIAZIDE ! PROPRANOLOL HCL ! METOPROLOL SUCCINATE ! VERAPAMIL ! * TOPIRAMATE ! * METRONIDAZOLE ! * TETRACYCLINE ! * AMLODIPINE ! PRILOSEC (OMEPRAZOLE)

## 2010-03-04 NOTE — Procedures (Signed)
Summary: summary report  summary report   Imported By: Mirna Mires 08/09/2009 13:51:54  _____________________________________________________________________  External Attachment:    Type:   Image     Comment:   External Document

## 2010-03-04 NOTE — Letter (Signed)
   [employer] at [facility] [address] [place], [place]  [address]  [place] Phone: [phone]      October 25, 2009   [facility] [address] [place], [place] [address]  RE:  LAB RESULTS  Dear  Ms. [facility],  The following is an interpretation of your most recent lab tests.  Please take note of any instructions provided or changes to medications that have resulted from your lab work.  Hemoccult Test for blood in stool:  negative    Sincerely Yours,    Lemont Fillers FNP  Appended Document:  mailed

## 2010-03-04 NOTE — Progress Notes (Signed)
Summary: Doxyclycline refill  Phone Note Call from Patient Call back at Home Phone [phone]   Caller: Patient Summary of Call: Pt states that [facility] still has not filled her Rx for Doxyclycline Initial call taken by: Lannette Donath,  October 24, 2009 4:49 PM  Follow-up for Phone Call        call placed to pharmacy to verify if rx for Doxycylcine has been filled. The pharmacist is requesting alternate medication. Allergy on file for Tetracycline. Pharmacy will not dispence Doxycycline to patient  Left message for pt to pick up new rx.  Doxy rx was [facility] at [facility]. Follow-up by: Glendell Docker CMA,  October 24, 2009 4:59 PM    New/Updated Medications: CLARITHROMYCIN 500 MG TABS (CLARITHROMYCIN) one tablet by mouth two times a day x 10 days Prescriptions: CLARITHROMYCIN 500 MG TABS (CLARITHROMYCIN) one tablet by mouth two times a day x 10 days  #20 x 0   Entered and Authorized by:   Lemont Fillers FNP   Signed by:   Lemont Fillers FNP on 10/25/2009   Method used:   Electronically to        [facility] # [phone]* (retail)       [address]       [place], [place]  [address]       Ph: [phone]       Fax: [phone]   RxID:   [id number]

## 2010-03-04 NOTE — Letter (Signed)
[facility]   Imported By: Lanelle Bal 08/19/2009 10:47:19  _____________________________________________________________________  External Attachment:    Type:   Image     Comment:   External Document

## 2010-03-04 NOTE — Progress Notes (Signed)
Summary: Nexium clarification  Phone Note From Pharmacy   Caller: [address] # [phone]* Summary of Call: Received fax from pharmacy wanting to know if Nexium rx was for powder pack or capsule. Left voicemail at pharmacy per Sandford Craze, NP to give capsules and to call if any questions. Nicki Guadalajara Fergerson CMA Duncan Dull)  October 22, 2009 11:43 AM

## 2010-03-04 NOTE — Letter (Signed)
Summary: [facility]: Immunoassay Fecal Occult Blood (iFOB) Order Psychologist, counselling at [facility]  [address]   [place], [place] [address]   Phone: [phone]  Fax: [phone]      [facility]: Immunoassay Fecal Occult Blood (iFOB) Order Form   October 21, 2009 MRN: [id number]   Natalie Cooley 1970-12-12   Physicican Name:______Melissa Peggyann Juba, NP  Diagnosis Code:_______789.06___________________      Mervin Kung CMA (AAMA)

## 2010-03-04 NOTE — Assessment & Plan Note (Signed)
Summary: CHEST PAIN X 2 DAYS-//VGJ--room 4   Vital Signs:  Patient profile:   40 year old female O2 Sat:      99 % on Room air Temp:     98.0 degrees F oral Pulse rate:   100 / minute Pulse rhythm:   regular Resp:     18 per minute BP sitting:   142 / 100  (right arm) Cuff size:   regular  Vitals Entered By: Mervin Kung CMA (Jul 02, 2009 10:33 AM)  O2 Flow:  Room air CC: room 4   Chest pain since Saturday, radiating to left arm and back. States she feels short of breath and has pain on inhalation. Denies n/v or diaphoresis. Is Patient Diabetic? No Pain Assessment Patient in pain? yes      Comments Pt states she stopped Metoprolol due to side effects.   Primary Care Provider:  Seymour Bars DO  CC:  room 4   Chest pain since Saturday and radiating to left arm and back. States she feels short of breath and has pain on inhalation. Denies n/v or diaphoresis.Marland Kitchen  History of Present Illness: Natalie Cooley is a 40 year old female who presents today with complaint of chest pain which started on Saturday.  She reports + anterior chest wall tenderness.  She tried tylenol without any improvement. Denies alleviating factors.  Notes + cough.  Cough is dry in nature and started yesterday.  Notes that chest pain radiates up her neck and down her left arm.  Denies fever.  Pt reports that she has stoppped metoprolol which she took for palpitations due to tongue swelling and difficulty breathing.  Notes + dyspnea, worse with exertion.  Reports 2 pillow orthopnea which is new.   Allergies: 1)  ! Inderal 2)  ! * Nifedipine 3)  ! Lisinopril 4)  ! Hydrochlorothiazide 5)  ! Propranolol Hcl 6)  ! Metoprolol Succinate 7)  ! Verapamil 8)  ! * Topiramate 9)  ! * Metronidazole 10)  ! * Tetracycline  Physical Exam  General:  Well-developed,well-nourished,in no acute distress; alert,appropriate and cooperative throughout examination Head:  Normocephalic and atraumatic without obvious abnormalities.  No apparent alopecia or balding. Chest Wall:  + tenderness to palpation. Lungs:  Normal respiratory effort, chest expands symmetrically. Lungs are clear to auscultation, no crackles or wheezes. Heart:  Normal rate and regular rhythm. S1 and S2 normal without gallop, murmur, click, rub or other extra sounds. Abdomen:  Bowel sounds positive,abdomen soft and non-tender without masses, organomegaly or hernias noted.   Impression & Recommendations:  Problem # 1:  CHEST PAIN (ICD-786.50) Assessment Deteriorated Will refer to ED for further evaluation and treatment.  EKG looks normal today.  Patient with both Typical and Atypical features. Chest wall tenderness, but pain radiates up neck and down left arm and also has orthopnea, and DOE.  Recommended to patient that she complete holter monitor and 2-D echo as well, per Dr Ludwig Clarks recommendation.   Orders: EKG w/ Interpretation (93000)  Complete Medication List: 1)  Lorazepam 0.5 Mg Tabs (Lorazepam) .... Take 1 tab by mouth three times a day 2)  Coral Calcium Plus Caps (Multiple vitamins-minerals) .... Take 1 capsule by mouth two times a day 3)  Garlic Oil 1000 Mg Caps (Garlic) .... Take 1 capsule by mouth once a day  Current Allergies (reviewed today): ! INDERAL ! * NIFEDIPINE ! LISINOPRIL ! HYDROCHLOROTHIAZIDE ! PROPRANOLOL HCL ! METOPROLOL SUCCINATE ! VERAPAMIL ! * TOPIRAMATE ! * METRONIDAZOLE ! *  TETRACYCLINE   Appended Document: CHEST PAIN X 2 DAYS-//VGJ--room 4     Allergies: 1)  ! Inderal 2)  ! * Nifedipine 3)  ! Lisinopril 4)  ! Hydrochlorothiazide 5)  ! Propranolol Hcl 6)  ! Metoprolol Succinate 7)  ! Verapamil 8)  ! * Topiramate 9)  ! * Metronidazole 10)  ! * Tetracycline   Impression & Recommendations:  Problem # 1:  CHEST PAIN (ICD-786.50)  Complete Medication List: 1)  Lorazepam 0.5 Mg Tabs (Lorazepam) .... Take 1 tab by mouth three times a day 2)  Coral Calcium Plus Caps (Multiple vitamins-minerals)  .... Take 1 capsule by mouth two times a day 3)  Garlic Oil 1000 Mg Caps (Garlic) .... Take 1 capsule by mouth once a day  Patient Instructions: 1)  Please go directly downstairs to the Emergency Room for further evaluation. 2)  Please call to arrange follow up with Dr. Cathey Endow after discharge from the ER. 3)  Please complete your 2-D echo and Holter Monitor.  You may call [phone] if you need to reschedule this appointment.

## 2010-03-04 NOTE — Progress Notes (Signed)
Summary: pt would like moniter results  Phone Note Call from Patient Call back at Home Phone [phone]   Caller: Patient Reason for Call: Talk to Nurse, Talk to Doctor, Lab or Test Results Summary of Call: pt would like results for moniter Initial call taken by: Omer Jack,  July 12, 2009 9:42 AM  Follow-up for Phone Call        Spoke with pt. Pt would like to know the 24 hrs holter monitor results. Monitor was d/c last week. Pt. has an appointment with Dr. Jens Som on 06/14th /11 at [place]. Pt  would like  know the results prior the office visit. I let pt. know will see if I can locate the monitor results. I will call her back asap. Ollen Gross, RN, BSN  July 12, 2009 9:57 AM Unable to locate the Holter monitor results to give to pt. I let pt. know that possible  the results will be given on the day of  the clinic  visit. Pt verbalized understanding. Follow-up by: Ollen Gross, RN, BSN,  July 12, 2009 10:16 AM

## 2010-03-04 NOTE — Assessment & Plan Note (Signed)
Summary: [facility]   Visit Type:  Follow-up Primary Provider:  Seymour Bars DO  CC:  follow up echo and holter.  History of Present Illness: 40 yo female with no prior cardiac history I recently saw for  evaluation chest pain, palpitations and hypertension. The patient is from [place]. She apparently was diagnosed with hypertension last fall. She has been tried on multiple medications. She has been intolerant to Inderal, nifedipine, lisinopril, HCTZ, propranolol, metoprolol, verapamil. She states most of these medications caused a headache. Lisinopril caused tongue swelling. An echocardiogram in May of 2011 showed normal LV function. A Holter monitor revealed sinus rhythm with occasional PACs. There appeared to be significant artifact in some of the monitor. When I saw her previously and we added Toprol but this apparently caused shortness of breath. Instead Norvasc was added. Since then she discontinued her Norvasc after 2 doses because she stated it caused headache, dizziness and extreme fatigue. She was seen in the room on Jul 02, 2009 with chest pain. The pain had been continuous for 4 days and increased with certain movements and palpation. Chest x-ray showed no infiltrate. A CBC, met, d-dimer and one set of cardiac markers were negative. She occasionally feels a sensation of dyspnea. She's had no further palpitations. There's been no syncope. She apparently checks her blood pressure multiple times each day.  Current Medications (verified): 1)  Lorazepam 0.5 Mg Tabs (Lorazepam) .... Take 1 Tab By Mouth Three Times A Day 2)  Coral Calcium Plus  Caps (Multiple Vitamins-Minerals) .... Take 1 Capsule By Mouth Two Times A Day  Allergies: 1)  ! Inderal 2)  ! * Nifedipine 3)  ! Lisinopril 4)  ! Hydrochlorothiazide 5)  ! Propranolol Hcl 6)  ! Metoprolol Succinate 7)  ! Verapamil 8)  ! * Topiramate 9)  ! * Metronidazole 10)  ! * Tetracycline 11)  ! * Amlodipine  Past History:  Past  Medical History: Reviewed history from 06/12/2009 and no changes required. ESSENTIAL HYPERTENSION, BENIGN (ICD-401.1) CERVICALGIA (ICD-723.1) ANXIETY STATE, UNSPECIFIED (ICD-300.00) MIGRAINE HEADACHE (ICD-346.90) gyn Dr Silva Bandy  Past Surgical History: Reviewed history from 04/30/2009 and no changes required. none  Social History: Reviewed history from 06/12/2009 and no changes required. Moved from [place] in 2001. 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 Tobacco Use - Yes.  Alcohol Use - no Drug Use - no  Review of Systems       no fevers or chills, productive cough, hemoptysis, dysphasia, odynophagia, melena, hematochezia, dysuria, hematuria, rash, seizure activity, orthopnea, PND, pedal edema, claudication. Remaining systems are negative.   Vital Signs:  Patient profile:   40 year old female Height:      63 inches Weight:      143 pounds BMI:     25.42 Pulse rate:   81 / minute Pulse rhythm:   regular Resp:     18 per minute BP sitting:   138 / 81  (left arm) Cuff size:   large  Vitals Entered By: Vikki Ports (July 16, 2009 4:10 PM)  Physical Exam  General:  Well-developed well-nourished in no acute distress.  Skin is warm and dry.  HEENT is normal.  Neck is supple. No thyromegaly.  Chest is clear to auscultation with normal expansion.  Cardiovascular exam is regular rate and rhythm.  Abdominal exam nontender or distended. No masses palpated. Extremities show no edema. neuro grossly intact    Impression & Recommendations:  Problem # 1:  CHEST PAIN (ICD-786.50) Symptoms are  atypical and most likely not cardiac. No further ischemia evaluation. The following medications were removed from the medication list:    Amlodipine Besylate 5 Mg Tabs (Amlodipine besylate) .Marland Kitchen... Take one tablet by mouth daily  Problem # 2:  ESSENTIAL HYPERTENSION, BENIGN (ICD-401.1) The patient has not tolerated any medications. Her husband states that she takes her  blood pressure multiple times during the day. I feel that her focus on her blood pressure causing anxiety with resulting elevated blood pressure. I've asked her to take her blood pressures once daily and keep records. He is not elevated today on no medications. I instructed her on lifestyle modification including diet, exercise and low sodium diet. If her blood pressure is elevated in the future then we will consider adding another medication but I am not convinced she will tolerate any meds. The following medications were removed from the medication list:    Amlodipine Besylate 5 Mg Tabs (Amlodipine besylate) .Marland Kitchen... Take one tablet by mouth daily  Problem # 3:  PALPITATIONS (ICD-785.1) Her Holter monitor showed sinus rhythm with PACs. There are 2 strips that appeared to be artifact but I will review this with one of our electrophysiologists to confirm. The following medications were removed from the medication list:    Amlodipine Besylate 5 Mg Tabs (Amlodipine besylate) .Marland Kitchen... Take one tablet by mouth daily  Problem # 4:  ANXIETY STATE, UNSPECIFIED (ICD-300.00)  Problem # 5:  MIGRAINE HEADACHE (ICD-346.90)  Patient Instructions: 1)  Your physician recommends that you schedule a follow-up appointment in: 3 months

## 2010-03-04 NOTE — Assessment & Plan Note (Signed)
Summary: Natalie Cooley   Visit Type:  Initial Consult Primary Provider:  Seymour Bars DO  CC:  HTN-Palpitations.  History of Present Illness: 40 yo female with no prior cardiac history or evaluation chest pain, palpitations and hypertension. The patient is from [place]. She apparently was diagnosed with hypertension last fall. She has been tried on multiple medications. She has been intolerant to Inderal, nifedipine, lisinopril, HCTZ, propranolol, metoprolol, verapamil. She states most of these medications caused a headache. Lisinopril caused tongue swelling. She did bring records today and her diastolic typically runs 90 with a systolic in the 1:30 to 150 range. She also describes heart racing mainly in the morning for approximately one hour after awakening. There is no associated symptoms. There is no dyspnea on exertion, orthopnea, PND or pedal edema. She has not had syncope. She occasionally feels tightness in her chest when her heart races. There is no associated symptoms. She does not have exertional chest pain. Because of the above we are asked to further evaluate.  Preventive Screening-Counseling & Management  Alcohol-Tobacco     Smoking Status: current      Drug Use:  no.    Current Medications (verified): 1)  Lorazepam 0.5 Mg Tabs (Lorazepam) .... Take 1 Tab By Mouth Three Times A Day 2)  Coral Calcium Plus  Caps (Multiple Vitamins-Minerals) .... Take 1 Capsule By Mouth Two Times A Day 3)  Garlic Oil 1000 Mg Caps (Garlic) .... Take 1 Capsule By Mouth Once A Day  Allergies: 1)  ! Inderal 2)  ! * Nifedipine 3)  ! Lisinopril 4)  ! Hydrochlorothiazide 5)  ! Propranolol Hcl 6)  ! Metoprolol Succinate 7)  ! Verapamil 8)  ! * Topiramate  Past History:  Past Medical History: ESSENTIAL HYPERTENSION, BENIGN (ICD-401.1) CERVICALGIA (ICD-723.1) ANXIETY STATE, UNSPECIFIED (ICD-300.00) MIGRAINE HEADACHE (ICD-346.90) gyn Dr Silva Bandy  Past Surgical History: Reviewed history  from 04/30/2009 and no changes required. none  Family History: Reviewed history from 04/30/2009 and no changes required. mother and father died in the War no sibblings No premature coronary artery disease  Social History: Reviewed history from 04/30/2009 and no changes required. Moved from [place] in 2001. 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 Tobacco Use - Yes.  Alcohol Use - no Drug Use - no Smoking Status:  current Drug Use:  no  Review of Systems       no fevers or chills, productive cough, hemoptysis, dysphasia, odynophagia, melena, hematochezia, dysuria, hematuria, rash, seizure activity, orthopnea, PND, pedal edema, claudication. Remaining systems are negative.   Vital Signs:  Patient profile:   40 year old female Height:      63 inches Weight:      142.75 pounds BMI:     25.38 Pulse rate:   91 / minute Pulse rhythm:   regular Resp:     18 per minute BP sitting:   148 / 88  (right arm) Cuff size:   large  Vitals Entered By: Vikki Ports (Jun 12, 2009 1:07 PM)  Physical Exam  General:  Well developed/well nourished in NAD Skin warm/dry Patient not depressed No peripheral clubbing Back-normal HEENT-normal/normal eyelids Neck supple/normal carotid upstroke bilaterally; no bruits; no JVD; no thyromegaly chest - CTA/ normal expansion CV - RRR/normal S1 and S2; no murmurs, rubs or gallops;  PMI nondisplaced Abdomen -NT/ND, no HSM, no mass, + bowel sounds, no bruit 2+ femoral pulses, no bruits Ext-no edema, chords, 2+ DP Neuro-grossly nonfocal     Impression & Recommendations:  Problem # 1:  PALPITATIONS (ICD-785.1) Etiology unclear. Scheduled for 48 hour Holter monitor. Orders: Holter Monitor (Holter Monitor)  Her updated medication list for this problem includes:    Metoprolol Succinate 25 Mg Xr24h-tab (Metoprolol succinate) .Marland Kitchen... Take one tablet by mouth daily  Her updated medication list for this problem includes:    Metoprolol  Succinate 25 Mg Xr24h-tab (Metoprolol succinate) .Marland Kitchen... Take one tablet by mouth daily  Problem # 2:  CHEST PAIN (ICD-786.50) Symptoms atypical. Electrocardiogram normal. We'll not pursue further at this point. Her updated medication list for this problem includes:    Metoprolol Succinate 25 Mg Xr24h-tab (Metoprolol succinate) .Marland Kitchen... Take one tablet by mouth daily  Problem # 3:  ESSENTIAL HYPERTENSION, BENIGN (ICD-401.1) Her blood pressure is elevated. She has had multiple medication intolerances. However many of these seem to be related to headache. She does have a history of migraines. She states that the Toprol caused headaches before but she only took 2 doses. I will try this again and I asked her to take this at night. Hopefully she will tolerate this as it would also treat her palpitations. She also will be a reasonable therapy for headaches indeed she has migraines. We discussed this at length. The following medications were removed from the medication list:    Hydrochlorothiazide 25 Mg Tabs (Hydrochlorothiazide) .Marland Kitchen... 1 tab by mouth qam Her updated medication list for this problem includes:    Metoprolol Succinate 25 Mg Xr24h-tab (Metoprolol succinate) .Marland Kitchen... Take one tablet by mouth daily  Orders: Echocardiogram (Echo)  Problem # 4:  ANXIETY STATE, UNSPECIFIED (ICD-300.00) Management per primary care.  Patient Instructions: 1)  Your physician recommends that you schedule a follow-up appointment in: 6 WEEKS 2)  Your physician has recommended you make the following change in your medication: RESTART METOPROLOL SUCC 25MG  ONCE DAILY 3)  Your physician has recommended that you wear a holter monitor.  Holter monitors are medical devices that record the heart's electrical activity. Doctors most often use these monitors to diagnose arrhythmias. Arrhythmias are problems with the speed or rhythm of the heartbeat. The monitor is a small, portable device. You can wear one while you do your normal  daily activities. This is usually used to diagnose what is causing palpitations/syncope (passing out). 4)  Your physician has requested that you have an echocardiogram.  Echocardiography is a painless test that uses sound waves to create images of your heart. It provides your doctor with information about the size and shape of your heart and how well your heart's chambers and valves are working.  This procedure takes approximately one hour. There are no restrictions for this procedure.  Appended Document: [place] Cooley Sinus rhythm at a rate of 91. Axis normal. Incomplete right bundle branch block

## 2010-03-04 NOTE — Progress Notes (Signed)
Summary: FYI  Phone Note Call from Patient   Caller: Patient Call For: Lemont Fillers FNP Summary of Call: FYI:  Pt called and stated she just got out of [facility] after having fibroids removed. Pt states she asked them to send [place] the records.  Went back to ER this past Saturday for chest pain and was told all testing negative but pt has been scheduled for TMST this Thursday @ 9:30 at [facility].  Pt has hosp. f/u with [place] tomorrow at 4pm. I have faxed records request for the ER visit. Nicki Guadalajara Fergerson CMA Duncan Dull)  November 19, 2009 4:12 PM

## 2010-03-04 NOTE — Procedures (Signed)
Summary: EGD/[facility]  [facility]   Imported By: Lanelle Bal 12/14/2009 09:12:22  _____________________________________________________________________  External Attachment:    Type:   Image     Comment:   External Document

## 2010-03-04 NOTE — Progress Notes (Signed)
Summary: results of heart monitor  Phone Note Call from Patient   Caller: Patient [phone] Reason for Call: Talk to Nurse Summary of Call: would like to get results from heart monitor-pls call [phone] Initial call taken by: Glynda Jaeger,  August 06, 2009 9:28 AM  Follow-up for Phone Call        left message for pt, dr Jens Som has monitor to review with EP. will be able to call pt back with results after dr Jens Som returns on friday Deliah Goody, RN  August 06, 2009 2:31 PM  per dr Jens Som monitor shows sinus with artifact, pt aware Deliah Goody, RN  August 09, 2009 10:36 AM

## 2010-03-04 NOTE — Letter (Signed)
[facility]   Imported By: Lanelle Bal 08/19/2009 10:46:40  _____________________________________________________________________  External Attachment:    Type:   Image     Comment:   External Document

## 2010-03-04 NOTE — Progress Notes (Signed)
Summary: speak to nurse  Phone Note Call from Patient Call back at Home Phone [phone] Call back at [phone]   Caller: Patient Reason for Call: Talk to Nurse Summary of Call: request to speak to nurse, would not state why Initial call taken by: Migdalia Dk,  July 03, 2009 10:38 AM  Follow-up for Phone Call        PER PT STOPPED METOPROLOL  AGAIN C/O CAUSING SOB . PLEASE ADVISE. Follow-up by: Scherrie Bateman, LPN,  July 03, 4008 11:04 AM  Additional Follow-up for Phone Call Additional follow up Details #1::        Continue off metoprolol; try norvasc 5 mg by mouth daily Ferman Hamming, MD, [facility]  July 03, 2009 4:04 PM      Appended Document: speak to nurse pt aware of med change, follow up appt made Deliah Goody, RN  July 05, 2009 3:11 PM\   Clinical Lists Changes  Medications: Added new medication of AMLODIPINE BESYLATE 5 MG TABS (AMLODIPINE BESYLATE) Take one tablet by mouth daily - Signed Rx of AMLODIPINE BESYLATE 5 MG TABS (AMLODIPINE BESYLATE) Take one tablet by mouth daily;  #30 x 12;  Signed;  Entered by: Deliah Goody, RN;  Authorized by: Ferman Hamming, MD, [facility];  Method used: Electronically to [facility] # [phone]*, [address], [place], [place], [place]  [address], Ph: [phone], Fax: [phone]    Prescriptions: AMLODIPINE BESYLATE 5 MG TABS (AMLODIPINE BESYLATE) Take one tablet by mouth daily  #30 x 12   Entered by:   Deliah Goody, RN   Authorized by:   Ferman Hamming, MD, [facility]   Signed by:   Deliah Goody, RN on 07/05/2009   Method used:   Electronically to        [facility] [address] [facility] # [phone]* (retail)       [address]       [place], [place]  [address]       Ph: [phone]       Fax: [phone]   RxID:   [id number]

## 2010-03-04 NOTE — Consult Note (Signed)
Summary: [facility]   Imported By: Lanelle Bal 05/13/2009 11:06:01  _____________________________________________________________________  External Attachment:    Type:   Image     Comment:   External Document

## 2010-03-04 NOTE — Progress Notes (Signed)
Summary: stress echo resutl, concerns  Phone Note Call from Patient Call back at [phone] cell   Caller: [facility] Call For: Lemont Fillers FNP Summary of Call: [facility] called to say pt's stress echo was normal. They counselled pt on this. Pt c/o diarrhea one day after taking Cymbalta and has stopped it. Randa Evens encouraged pt of need to continue Cymbalta and to give it time but wanted to let us know. She will fax stress echo result today. Nicki Guadalajara Fergerson CMA Duncan Dull)  November 22, 2009 10:53 AM   Follow-up for Phone Call        Pt called and verified that she took one dose of Cymbalta and had diarrhea that evening. Has not taken med since. Has not had other diarrhea episodes or fever.  Pt wants to know if this is normal and should she continue taking it?  Nicki Guadalajara Fergerson CMA Duncan Dull)  November 22, 2009 11:05 AM   Additional Follow-up for Phone Call Additional follow up Details #1::        The diarrhea may be a side effect of this medication.  I would recommend that she restart the medication.  She can use immodium as needed for diarrhea.  If it is due to the medication it should hopefully improve with time on the medication.  If diarrhea is severe or persists for more than one week she should let us know.   Additional Follow-up by: Lemont Fillers FNP,  November 22, 2009 12:30 PM    Additional Follow-up for Phone Call Additional follow up Details #2::    Pt advised per Calah Gershman's instructions and states she will restart med tonight. Nicki Guadalajara Fergerson CMA Duncan Dull)  November 22, 2009 2:53 PM

## 2010-03-04 NOTE — Progress Notes (Signed)
Summary: pt want echo results  Phone Note Call from Patient Call back at Home Phone [phone] Call back at [phone]   Caller: Patient Reason for Call: Talk to Nurse, Talk to Doctor, Lab or Test Results Summary of Call: pt would like to make appt and get echo results Initial call taken by: Omer Jack,  July 03, 2009 8:37 AM  Follow-up for Phone Call        left message for pt, she is scheduled to see dr Jens Som in [place] on 07-17-09. Deliah Goody, RN  July 03, 2009 9:58 AM

## 2010-03-04 NOTE — Progress Notes (Signed)
----   Converted from flag ---- ---- 10/22/2009 3:25 PM, Darral Dash wrote:     Appt with Dr  Diona Browner scheduled for Sept  21   , pt does not want appt said they don't have the money, suggested they call to make pmt arrangement ,Appt cancelled   ---- 10/22/2009 2:38 PM, Lemont Fillers FNP wrote: just want to make sure that the GI consult came through to you. thanks ------------------------------

## 2010-03-04 NOTE — Progress Notes (Signed)
Summary: Lab Results   Phone Note Outgoing Call   Summary of Call: Pls call patient and let her know that it looks like she may have a mild bacterial vaginitis- I will send a prescription to her pharmacy. Also, the lab called and said that they only had enough blood to run two of the blood tests.  Could we please have her return to the lab to redraw ESR (729.1)   Initial call taken by: Lemont Fillers FNP,  August 08, 2009 10:00 AM  Follow-up for Phone Call        patient advised per [facility] instructions. She states she wil return Friday 08/09/2009 for lab draw. Follow-up by: Glendell Docker CMA,  August 08, 2009 4:44 PM    New/Updated Medications: CLEOCIN 2 % CREA (CLINDAMYCIN PHOSPHATE) 5 grams intravaginally at bedtime x 7 days Prescriptions: CLEOCIN 2 % CREA (CLINDAMYCIN PHOSPHATE) 5 grams intravaginally at bedtime x 7 days  #1 x 0   Entered and Authorized by:   Lemont Fillers FNP   Signed by:   Lemont Fillers FNP on 08/08/2009   Method used:   Electronically to        [facility] # [phone]* (retail)       [address]       [place], [place]  [address]       Ph: [phone]       Fax: [phone]   RxID:   [id number]

## 2010-03-06 NOTE — Assessment & Plan Note (Signed)
Summary: migraine   Vital Signs:  Patient profile:   41 year old female Height:      63 inches Weight:      136 pounds BMI:     24.18 O2 Sat:      97 % on Room air Temp:     98.5 degrees F oral Pulse rate:   95 / minute BP sitting:   133 / 85  (left arm) Cuff size:   regular  Vitals Entered By: Payton Spark CMA (January 16, 2010 11:13 AM)  O2 Flow:  Room air CC: Head pressure, body aches, HA and dizziness x 4 days.   Primary Care Provider:  Lemont Fillers FNP  CC:  Head pressure, body aches, and HA and dizziness x 4 days.Marland Kitchen  History of Present Illness: 40 yo [place] Female presents for not feeling well x 5 days.  She has been having malaise and bodyaches with sore throat pain and epigastric pain.  She feels dizzy but she has facial pressure and HA.  Her BPs have been going up per her report.  Denies N/V/D.  She has been coughing more at night.  No sick contacts.  Did not get flu shot this year.    Denies fevers or chills.  She has hx of migraines and fibromyalgia.  Tried Tyelnol and Ibuprofen but nothing is helping.    Current Medications (verified): 1)  Lorazepam 0.5 Mg Tabs (Lorazepam) .... Take 1 Tablet By Mouth Four Times A Day 2)  Nexium 40 Mg Pack (Esomeprazole Magnesium) .... One Tablet By Mouth Daily 3)  Naproxen Sodium 550 Mg Tabs (Naproxen Sodium) .... Take 1 Tab By Mouth Two Times A Day 4)  Imipramine Hcl 10 Mg Tabs (Imipramine Hcl) .... Take 2 Tab By Mouth At Bedtime  Allergies (verified): 1)  ! Inderal 2)  ! * Nifedipine 3)  ! Lisinopril 4)  ! Hydrochlorothiazide 5)  ! Propranolol Hcl 6)  ! Metoprolol Succinate 7)  ! Verapamil 8)  ! * Topiramate 9)  ! * Metronidazole 10)  ! * Tetracycline 11)  ! * Amlodipine 12)  ! Prilosec (Omeprazole) 13)  ! Amoxicillin 14)  ! Hydrocodone 15)  ! Levaquin 16)  ! * Vicoprofen 17)  ! * Omeprazole  Past History:  Past Medical History: Reviewed history from 06/12/2009 and no changes required. ESSENTIAL  HYPERTENSION, BENIGN (ICD-401.1) CERVICALGIA (ICD-723.1) ANXIETY STATE, UNSPECIFIED (ICD-300.00) MIGRAINE HEADACHE (ICD-346.90) gyn Dr Silva Bandy  Social History: Reviewed history from 06/12/2009 and no changes required. Moved from [place] in 2001. 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 Tobacco Use - Yes.  Alcohol Use - no Drug Use - no  Review of Systems      See HPI  Physical Exam  General:  alert, well-developed, well-nourished, and well-hydrated.   Head:  normocephalic and atraumatic.  bitemporal tenderness Eyes:  pupils equal, pupils round, and pupils reactive to light.  photophobia, no nystagmus Ears:  EACs patent; TMs translucent and gray with good cone of light and bony landmarks.  Nose:  no nasal discharge.   Mouth:  pharynx pink and moist.   Neck:  no masses.  no nuchal rigidity Lungs:  Normal respiratory effort, chest expands symmetrically. Lungs are clear to auscultation, no crackles or wheezes. Heart:  Normal rate and regular rhythm. S1 and S2 normal without gallop, murmur, click, rub or other extra sounds. Msk:  no joint swelling, no joint warmth, and no redness over joints.   Skin:  color normal.  Psych:  flat affect.     Impression & Recommendations:  Problem # 1:  MIGRAINE HEADACHE (ICD-346.90) Assessment Deteriorated  5 days of Migraine, now intractable. Will treat with Toradol 60 mg IM + Phenergan 25 mg IM x 1 today.  Go home and rest and hydrate. RX for Zofran will be given to use if nausea returns.  Can use OTC eXcedrin migraine if needed. Call PCP if not feeling better tomorrow. The following medications were removed from the medication list:    Tylenol Extra Strength 500 Mg Tabs (Acetaminophen) .Marland Kitchen... 1 tablet four times daily Her updated medication list for this problem includes:    Naproxen Sodium 550 Mg Tabs (Naproxen sodium) .Marland Kitchen... Take 1 tab by mouth two times a day  Orders: Ketorolac-Toradol 15mg  (V2536) Promethazine up to 50mg   (J2550) Admin of Therapeutic Inj  intramuscular or subcutaneous (64403)  Complete Medication List: 1)  Lorazepam 0.5 Mg Tabs (Lorazepam) .... Take 1 tablet by mouth four times a day 2)  Nexium 40 Mg Pack (Esomeprazole magnesium) .... One tablet by mouth daily 3)  Naproxen Sodium 550 Mg Tabs (Naproxen sodium) .... Take 1 tab by mouth two times a day 4)  Imipramine Hcl 10 Mg Tabs (Imipramine hcl) .... Take 2 tab by mouth at bedtime 5)  Ondansetron 8 Mg Tbdp (Ondansetron) .Marland Kitchen.. 1 tab by mouth q 8 hrs as needed nausea  Patient Instructions: 1)  Toradol + Phenergan given today for headache and nausea. 2)  Go home and rest and hydrate. 3)  RX for Odansetron given for nausea -- pick up at pharmacy. 4)  Use OTC Excedrin Migraine if needed for return of headache. 5)  Call your PCP if not feeling better tomorrow. Prescriptions: ONDANSETRON 8 MG TBDP (ONDANSETRON) 1 tab by mouth q 8 hrs as needed nausea  #20 x 0   Entered and Authorized by:   Seymour Bars DO   Signed by:   Seymour Bars DO on 01/16/2010   Method used:   Electronically to        [facility] # [phone]* (retail)       [address] [place] [address]       [place], [place]  [address]       Ph: [phone]       Fax: [phone]   RxID:   [id number]    Medication Administration  Injection # 1:    Medication: Ketorolac-Toradol 15mg     Diagnosis: MIGRAINE HEADACHE (ICD-346.90)    Route: IM    Site: LUOQ gluteus    Exp Date: 07/04/2011    Lot #: [id number]    Comments: 60mg     Patient tolerated injection without complications    Given by: Payton Spark CMA (January 16, 2010 11:43 AM)  Injection # 2:    Medication: Promethazine up to 50mg     Diagnosis: MIGRAINE HEADACHE (ICD-346.90)    Route: IM    Site: LUOQ gluteus    Exp Date: 09/2011    Lot #: [id number]    Comments: 25mg      Patient tolerated injection without complications    Given by: Payton Spark CMA (January 16, 2010 11:44 AM)  Orders Added: 1)   Ketorolac-Toradol 15mg  [J1885] 2)  Promethazine up to 50mg  [J2550] 3)  Admin of Therapeutic Inj  intramuscular or subcutaneous [96372] 4)  Est. Patient Level III [83151]     Medication Administration  Injection # 1:    Medication: Ketorolac-Toradol  15mg     Diagnosis: MIGRAINE HEADACHE (ICD-346.90)    Route: IM    Site: LUOQ gluteus    Exp Date: 07/04/2011    Lot #: [id number]    Comments: 60mg     Patient tolerated injection without complications    Given by: Payton Spark CMA (January 16, 2010 11:43 AM)  Injection # 2:    Medication: Promethazine up to 50mg     Diagnosis: MIGRAINE HEADACHE (ICD-346.90)    Route: IM    Site: LUOQ gluteus    Exp Date: 09/2011    Lot #: [id number]    Comments: 25mg      Patient tolerated injection without complications    Given by: Payton Spark CMA (January 16, 2010 11:44 AM)  Orders Added: 1)  Ketorolac-Toradol 15mg  [J1885] 2)  Promethazine up to 50mg  [J2550] 3)  Admin of Therapeutic Inj  intramuscular or subcutaneous [96372] 4)  Est. Patient Level III [82956]

## 2010-03-12 ENCOUNTER — Ambulatory Visit (INDEPENDENT_AMBULATORY_CARE_PROVIDER_SITE_OTHER): Payer: BC Managed Care – PPO | Admitting: Family

## 2010-03-12 ENCOUNTER — Encounter: Payer: Self-pay | Admitting: Family

## 2010-03-12 DIAGNOSIS — K219 Gastro-esophageal reflux disease without esophagitis: Secondary | ICD-10-CM | POA: Insufficient documentation

## 2010-03-12 DIAGNOSIS — G43909 Migraine, unspecified, not intractable, without status migrainosus: Secondary | ICD-10-CM

## 2010-03-12 DIAGNOSIS — R42 Dizziness and giddiness: Secondary | ICD-10-CM | POA: Insufficient documentation

## 2010-03-12 DIAGNOSIS — R350 Frequency of micturition: Secondary | ICD-10-CM

## 2010-03-12 DIAGNOSIS — R1084 Generalized abdominal pain: Secondary | ICD-10-CM

## 2010-03-12 DIAGNOSIS — B359 Dermatophytosis, unspecified: Secondary | ICD-10-CM | POA: Insufficient documentation

## 2010-03-12 LAB — CONVERTED CEMR LAB
Bilirubin Urine: NEGATIVE
Eosinophils Absolute: 0 10*3/uL (ref 0.0–0.7)
Eosinophils Relative: 1 % (ref 0–5)
Glucose, Urine, Semiquant: NEGATIVE
Ketones, urine, test strip: NEGATIVE
MCHC: 33.9 g/dL (ref 30.0–36.0)
Neutrophils Relative %: 47 % (ref 43–77)
RDW: 12.9 % (ref 11.5–15.5)
Specific Gravity, Urine: <1.005
WBC Urine, dipstick: NEGATIVE
WBC: 6.1 10*3/uL (ref 4.0–10.5)

## 2010-03-13 ENCOUNTER — Telehealth: Payer: Self-pay | Admitting: Family

## 2010-03-14 ENCOUNTER — Encounter: Payer: Self-pay | Admitting: Family

## 2010-03-17 ENCOUNTER — Telehealth: Payer: Self-pay | Admitting: Family

## 2010-03-20 NOTE — Progress Notes (Signed)
Summary: cream, zegerid, treximet concerns  Phone Note Call from Patient   Caller: Patient Call For: Lemont Fillers FNP Summary of Call: Received message from pt stating she would prefer a cream for her rash instead of the pill that we sent to pharmacy. Also has question about Sodium Bicarbonate rx. Attempted to reach pt and left message for pt to return my call. Initial call taken by: Mervin Kung CMA Duncan Dull),  March 13, 2010 2:33 PM  Follow-up for Phone Call        patient request a return call from Nicki Guadalajara at [phone] Follow-up by: Glendell Docker CMA,  March 13, 2010 3:21 PM  Additional Follow-up for Phone Call Additional follow up Details #1::        Spoke to pt, she states she could not find Zegerid OTC and would like rx sent to pharmacy.  1.  Spoke to pharmacist and Zegerid will require PA. I will call [phone] and initiate process.  2. Also states Treximet is not helping her headache. Took 2 yesterday and 1 today. Makes her feel very sleepy after taking it. Also has body aches after taking the med. Thinks it is elevating her BP as reading at home today was 164/97.  3. Pt was expecting a cream for her rash instead of pills. Please advise.  Additional Follow-up by: Mervin Kung CMA Duncan Dull),  March 13, 2010 3:44 PM    Additional Follow-up for Phone Call Additional follow up Details #2::    1. Instead of Zegerid, I would like her to continue the nexium but add sodium bicarbonate as listed.   2. For headache- She can stop treximet.   I will send rx for fioricet to her pharmacy and refer her to headache clinic.  3.  BP- I would like for her to follow up in office in 2 weeks please.   Follow-up by: Lemont Fillers FNP,  March 14, 2010 8:23 AM  Additional Follow-up for Phone Call Additional follow up Details #3:: Details for Additional Follow-up Action Taken: Pt advised of Kazmir Oki's instructions and voices understanding. Pt states she has seen Dr  Elmer Sow @ [facility] for her headaches in the past. Would like to be referred back to him unless you think the Headache Clinic will be better. Please advise.  Nicki Guadalajara Fergerson CMA (AAMA)  March 14, 2010 9:30 AM   I will arrange f/u apt with Dr. Antonietta Barcelona. Additional Follow-up by: Lemont Fillers FNP,  March 14, 2010 12:57 PM  Prescriptions: SODIUM BICARBONATE 648 MG TABS (SODIUM BICARBONATE) 2 tabs by mouth daily  #60 x 1   Entered by:   Mervin Kung CMA (AAMA)   Authorized by:   Lemont Fillers FNP   Signed by:   Mervin Kung CMA (AAMA) on 03/14/2010   Method used:   Electronically to        [facility] # [phone]* (retail)       [address]       [place], [place]  [address]       Ph: [phone]       Fax: [phone]   RxID:   [id number]

## 2010-03-20 NOTE — Letter (Signed)
   [place] at [facility] [address] [place], [place]  [address]  [place] Phone: [phone]      March 14, 2010   [facility] [address] [place], [place] [address]  RE:  LAB RESULTS  Dear  Ms. [facility],  The following is an interpretation of your most recent lab tests.  Please take note of any instructions provided or changes to medications that have resulted from your lab work.   CBC:  Good - no changes needed You are not anemic at this time.   Sincerely Yours,    Lemont Fillers FNP  Appended Document:  mailed

## 2010-03-20 NOTE — Assessment & Plan Note (Signed)
Summary: head and chest congestion/mhf--rm 4   Vital Signs:  Patient profile:   40 year old female Height:      63 inches Weight:      137 pounds BMI:     24.36 Temp:     97.8 degrees F oral Pulse rate:   78 / minute Pulse rhythm:   regular Resp:     16 per minute BP sitting:   124 / 88  (right arm) Cuff size:   regular  Vitals Entered By: Mervin Kung CMA Duncan Dull) (March 12, 2010 9:33 AM) CC: Pt states she has had a headache and cough x 2 weeks. Nausea in the mornings and with coughing. Sharp pain in chest when breathes since Friday.  Very fatigued. Is Patient Diabetic? No Pain Assessment Patient in pain? no        Primary Care Provider:  Lemont Fillers FNP  CC:  Pt states she has had a headache and cough x 2 weeks. Nausea in the mornings and with coughing. Sharp pain in chest when breathes since Friday.  Very fatigued.Marland Kitchen  History of Present Illness: Natalie Cooley is a 40 year old female who presents today with chief complaint of HA.    1)HA- localized to the left frontal side of her head.  Started 2 weeks ago.  Describes pain as "stabbing like a knife."  Reports pain is 9/10.  Continues to have associated dizziness.  HA is worsened by loud sounds.  Symptoms are improved by nothing.  Denies associated nasal congestion.  Did have a nose bleed.    2) Cough-  notes associated heartburn and anerior chest wall tendernes. + stomach tenderness with palpation.  + urinary frequency.  She continues nexium.  Some discomfort with swalllowing.  3) Anxiety- is seeing Dr.  Sandria Manly psychiatry.  Started Keppra on Monday, has not seen improvement yet in her anxiety.   Lorazepam helps "a little bit."  Allergies: 1)  ! Inderal 2)  ! * Nifedipine 3)  ! Lisinopril 4)  ! Hydrochlorothiazide 5)  ! Propranolol Hcl 6)  ! Metoprolol Succinate 7)  ! Verapamil 8)  ! * Topiramate 9)  ! * Metronidazole 10)  ! * Tetracycline 11)  ! * Amlodipine 12)  ! Prilosec (Omeprazole) 13)  !  Amoxicillin 14)  ! Hydrocodone 15)  ! Levaquin 16)  ! * Vicoprofen 17)  ! * Omeprazole  Past History:  Past Medical History: Last updated: 06/12/2009 ESSENTIAL HYPERTENSION, BENIGN (ICD-401.1) CERVICALGIA (ICD-723.1) ANXIETY STATE, UNSPECIFIED (ICD-300.00) MIGRAINE HEADACHE (ICD-346.90) gyn Dr Silva Bandy  Past Surgical History: Last updated: 11/20/2009 myomectomy 10/11  Review of Systems       Left shoulder pain with movement. +c/o anterior chest wall tenderness c/o skin rash on back c/o chronic dizziness  Physical Exam  General:  Well-developed,well-nourished,in no acute distress; alert,appropriate and cooperative throughout examination Head:  Normocephalic and atraumatic without obvious abnormalities. No apparent alopecia or balding. Eyes:  PERRLA, sclera are clear Ears:  External ear exam shows no significant lesions or deformities.  Otoscopic examination reveals clear canals, tympanic membranes are intact bilaterally without bulging, retraction, inflammation or discharge. Hearing is grossly normal bilaterally. Neck:  No deformities, masses, or tenderness noted. Lungs:  Normal respiratory effort, chest expands symmetrically. Lungs are clear to auscultation, no crackles or wheezes. Heart:  Normal rate and regular rhythm. S1 and S2 normal without gallop, murmur, click, rub or other extra sounds. Abdomen:  diffuse abdominal tenderness, abdomen is soft with + bowel sounds.  No guarding Msk:  + anterior wall chest tenderness Skin:  + hyperpigmented round slightly raised lesions noted on left upper back and beneath left arm. Psych:  Oriented X3, normally interactive, and slightly anxious.     Impression & Recommendations:  Problem # 1:  MIGRAINE HEADACHE (ICD-346.90) Assessment Deteriorated Will give patient trial of treximet #4 samples provided to patient.   Pt will let us know if this helps her and if so, will give her trial of imitrex. (treximet [id number] mar 2013)     Her updated medication list for this problem includes:    Naproxen Sodium 550 Mg Tabs (Naproxen sodium) .Marland Kitchen... Take 1 tab by mouth two times a day    Treximet 85-500 Mg Tabs (Sumatriptan-naproxen sodium) ..... One tablet as needed at start of migraine.  may repeat once two hours later if headache is not resolved.  Problem # 2:  DIZZINESS (ICD-780.4) Assessment: Comment Only Will check CBC.  Ear exam is WNL.  Denies sinus drainage. The following medications were removed from the medication list:    Ondansetron 8 Mg Tbdp (Ondansetron) .Marland Kitchen... 1 tab by mouth q 8 hrs as needed nausea  Orders: TLB-CBC Platelet - w/Differential (85025-CBCD)  Problem # 3:  GERD (ICD-530.81) Assessment: Deteriorated Will plan to add sodium bicarbonate to her regimen and continue nexium.  Rx for zegerid was cancelled due to intolerance to omeprazole.  Also instructed pt to stop NSAIDS (Naproxen and ibuprofen) Her updated medication list for this problem includes:    Nexium 40 Mg Cpdr (Esomeprazole magnesium) ..... One tablet by mouth daily    Sodium Bicarbonate 648 Mg Tabs (Sodium bicarbonate) .Marland Kitchen... 2 tabs by mouth daily  Problem # 4:  RINGWORM (ICD-110.9) Assessment: New Will treat with terbinafine. LFT's have been normal.  Problem # 5:  ANXIETY STATE, UNSPECIFIED (ICD-300.00) Assessment: Unchanged She is now following with  Dr. Sandria Manly Psychiatry.  Defer management to psych.  She reports that she is currently using her short term disability due to anxiety.  I also suspect that some of her somatic complaints may also be linked to her anxiety.   The following medications were removed from the medication list:    Imipramine Hcl 10 Mg Tabs (Imipramine hcl) .Marland Kitchen... Take 2 tab by mouth at bedtime Her updated medication list for this problem includes:    Lorazepam 0.5 Mg Tabs (Lorazepam) .Marland Kitchen... Take 1 tablet by mouth four times a day  Problem # 6:  FREQUENCY, URINARY (ICD-788.41) Assessment: Comment Only UA is  negative.    Complete Medication List: 1)  Lorazepam 0.5 Mg Tabs (Lorazepam) .... Take 1 tablet by mouth four times a day 2)  Nexium 40 Mg Cpdr (Esomeprazole magnesium) .... One tablet by mouth daily 3)  Naproxen Sodium 550 Mg Tabs (Naproxen sodium) .... Take 1 tab by mouth two times a day 4)  Levetiracetam 250 Mg Tabs (Levetiracetam) .... Take 1/2 tablet at bedtime. 5)  Vitamin B-12 500 Mcg Tabs (Cyanocobalamin) .... Take 1 tablet by mouth once a day. 6)  Calcium Carbonate 600 Mg Tabs (Calcium carbonate) .... Take 1 tablet by mouth two times a day. 7)  Vitamin E Complex 400 Unit Caps (Vitamin e) .... Take 2 capsule daily. 8)  Treximet 85-500 Mg Tabs (Sumatriptan-naproxen sodium) .... One tablet as needed at start of migraine.  may repeat once two hours later if headache is not resolved. 9)  Sodium Bicarbonate 648 Mg Tabs (Sodium bicarbonate) .... 2 tabs by mouth daily 10)  Terbinafine Hcl 250  Mg Tabs (Terbinafine hcl) .... One tablet by mouth once daily for 2 weeks for skin rash  Other Orders: UA Dipstick w/o Micro (manual) (62130)  Patient Instructions: 1)  Call if your HA worsens or if it does not improve. 2)  Follow up in 1 month. Prescriptions: TERBINAFINE HCL 250 MG TABS (TERBINAFINE HCL) one tablet by mouth once daily for 2 weeks for skin rash  #14 x 0   Entered and Authorized by:   Lemont Fillers FNP   Signed by:   Lemont Fillers FNP on 03/12/2010   Method used:   Electronically to        [facility] # [phone]* (retail)       [address]       [place], [place]  [address]       Ph: [phone]       Fax: [phone]   RxID:   [id number] ZEGERID 40-1100 MG CAPS (OMEPRAZOLE-SODIUM BICARBONATE) one cap by mouth daily for reflux  #30 x 2   Entered and Authorized by:   Lemont Fillers FNP   Signed by:   Lemont Fillers FNP on 03/12/2010   Method used:   Electronically to        [facility] # [phone]* (retail)        [address]       [place], [place]  [address]       Ph: [phone]       Fax: [phone]   RxID:   [id number] TREXIMET 85-500 MG TABS (SUMATRIPTAN-NAPROXEN SODIUM) one tablet as needed at start of migraine.  May repeat once two hours later if headache is not resolved.  #4 x 0   Entered and Authorized by:   Lemont Fillers FNP   Signed by:   Lemont Fillers FNP on 03/12/2010   Method used:   Samples Given   RxID:   [id number]    Orders Added: 1)  UA Dipstick w/o Micro (manual) [81002] 2)  TLB-CBC Platelet - w/Differential [85025-CBCD] 3)  Est. Patient Level IV [06269]    Current Allergies (reviewed today): ! INDERAL ! * NIFEDIPINE ! LISINOPRIL ! HYDROCHLOROTHIAZIDE ! PROPRANOLOL HCL ! METOPROLOL SUCCINATE ! VERAPAMIL ! * TOPIRAMATE ! * METRONIDAZOLE ! * TETRACYCLINE ! * AMLODIPINE ! PRILOSEC (OMEPRAZOLE) ! AMOXICILLIN ! HYDROCODONE ! LEVAQUIN ! * VICOPROFEN ! * OMEPRAZOLE  Laboratory Results   Urine Tests   Date/Time Reported: Mervin Kung CMA (AAMA)  March 12, 2010 10:08 AM   Routine Urinalysis   Color: yellow Appearance: Clear Glucose: negative   (Normal Range: Negative) Bilirubin: negative   (Normal Range: Negative) Ketone: negative   (Normal Range: Negative) Spec. Gravity: <1.005   (Normal Range: 1.003-1.035) Blood: negative   (Normal Range: Negative) pH: 6.0   (Normal Range: 5.0-8.0) Protein: negative   (Normal Range: Negative) Urobilinogen: 0.2   (Normal Range: 0-1) Nitrite: negative   (Normal Range: Negative) Leukocyte Esterace: negative   (Normal Range: Negative)

## 2010-03-20 NOTE — Miscellaneous (Signed)
  Clinical Lists Changes  Medications: Added new medication of LAMISIL AT 1 % CREA (TERBINAFINE HCL) apply twice daily to rash - Signed Added new medication of FIORICET 50-325-40 MG TABS (BUTALBITAL-APAP-CAFFEINE) one tablet by mouth two times a day as needed severe Headache - Signed Rx of LAMISIL AT 1 % CREA (TERBINAFINE HCL) apply twice daily to rash;  #1 x 2;  Signed;  Entered by: Lemont Fillers FNP;  Authorized by: Lemont Fillers FNP;  Method used: Electronically to [facility] # [phone]*, [address] [place], [place], [place]  [address], Ph: [phone], Fax: [phone] Rx of FIORICET 50-325-40 MG TABS [facility]) one tablet by mouth two times a day as needed severe Headache;  #30 x 0;  Signed;  Entered by: Lemont Fillers FNP;  Authorized by: Lemont Fillers FNP;  Method used: Printed then faxed to [facility] # [id number]*, [address], [place], [place], [place]  [address], Ph: [phone], Fax: [phone]    Prescriptions: FIORICET 50-325-40 MG TABS [facility]) one tablet by mouth two times a day as needed severe Headache  #30 x 0   Entered and Authorized by:   Lemont Fillers FNP   Signed by:   Lemont Fillers FNP on 03/14/2010   Method used:   Printed then faxed to ...       [facility]  [address] # [phone]* (retail)       [phone]       [place], [place]  [address]       Ph: [phone]       Fax: [phone]   RxID:   [id number] LAMISIL AT 1 % CREA (TERBINAFINE HCL) apply twice daily to rash  #1 x 2   Entered and Authorized by:   Lemont Fillers FNP   Signed by:   Lemont Fillers FNP on 03/14/2010   Method used:   Electronically to        [facility] # [id number]* (retail)       [address]       [place], [place]  [address]       Ph: [phone]       Fax: [phone]   RxID:   [id number]

## 2010-03-26 NOTE — Progress Notes (Signed)
Summary: postpone referral, lamisil substitute  Phone Note Call from Patient   Caller: Patient Call For: Lemont Fillers FNP Summary of Call: Received call from pt stating she wants to wait on referral to [facility] until she tries medication we prescribed due to financial difficulties at present.  Please advise. Initial call taken by: Mervin Kung CMA Duncan Dull),  March 17, 2010 11:40 AM  Follow-up for Phone Call        OK- please advise pt to call us if she changes her mind and decides she would like to procede with referral. Follow-up by: Lemont Fillers FNP,  March 17, 2010 12:37 PM  Additional Follow-up for Phone Call Additional follow up Details #1::        Pt advised. She states that she went to the pharmacy yesterday and they told her they did not receive the Lamisil Rx. Advised pt I would call and verify with pharmacy and to check with them this afternoon to see if it is ready. Attempted to reach pharmacy but they are at lunch; will try again later today.  Additional Follow-up by: Mervin Kung CMA Duncan Dull),  March 17, 2010 1:42 PM    Additional Follow-up for Phone Call Additional follow up Details #2::    Pharmacy did not have lamisil cream on hand. They recommended pt try Terbenafine antifungal cream OTC. Pt wants to make sure this is ok for her to use. Please advise.  Follow-up by: Mervin Kung CMA Duncan Dull),  March 18, 2010 11:36 AM  Additional Follow-up for Phone Call Additional follow up Details #3:: Details for Additional Follow-up Action Taken: OK to use. Lemont Fillers FNP,  March 18, 2010 11:56 AM  Pt notified. Nicki Guadalajara Fergerson CMA Duncan Dull)  March 19, 2010 8:36 AM

## 2010-03-28 ENCOUNTER — Ambulatory Visit (INDEPENDENT_AMBULATORY_CARE_PROVIDER_SITE_OTHER): Payer: BC Managed Care – PPO | Admitting: Family

## 2010-03-28 ENCOUNTER — Encounter: Payer: Self-pay | Admitting: Family

## 2010-03-28 DIAGNOSIS — I10 Essential (primary) hypertension: Secondary | ICD-10-CM

## 2010-03-28 DIAGNOSIS — Z872 Personal history of diseases of the skin and subcutaneous tissue: Secondary | ICD-10-CM

## 2010-03-28 DIAGNOSIS — K219 Gastro-esophageal reflux disease without esophagitis: Secondary | ICD-10-CM

## 2010-03-28 LAB — CONVERTED CEMR LAB
IgE (Immunoglobulin E), Serum: 68.8 intl units/mL (ref 0.0–180.0)
IgE (Immunoglobulin E), Serum: 73.7 intl units/mL (ref 0.0–180.0)

## 2010-03-31 ENCOUNTER — Encounter: Payer: Self-pay | Admitting: Family

## 2010-03-31 ENCOUNTER — Telehealth: Payer: Self-pay | Admitting: Family

## 2010-04-10 NOTE — Assessment & Plan Note (Signed)
Summary: follow up of blood pressure--rm 4   Vital Signs:  Patient profile:   40 year old female Height:      63 inches Weight:      138.56 pounds BMI:     24.63 Temp:     98.1 degrees F oral Pulse rate:   78 / minute Pulse rhythm:   regular Resp:     16 per minute BP sitting:   126 / 90  (left arm) Cuff size:   regular  Vitals Entered By: Mervin Kung CMA Duncan Dull) (March 28, 2010 10:47 AM) CC: Pt here for follow up of  blood pressure. Is Patient Diabetic? No Pain Assessment Patient in pain? no      Comments Pt no longer takes Naproxen, levetiracetam (headache, dizziness, dry mouth), sodium bicarbonate (didn't help), terbinafine (dizziness, elevated BP)  or fioricet(didn't help). Nicki Guadalajara Fergerson CMA Duncan Dull)  March 28, 2010 11:35 AM    Primary Care Provider:  Lemont Fillers FNP  CC:  Pt here for follow up of  blood pressure.Marland Kitchen  History of Present Illness: Natalie Cooley is a 40 year old female who presents today with concerns about her blood pressure.  she has been checking at home with her meter and has been getting readings with a high diastolic pressure (greater than 90).  Overall, her systolic pressures have been in the 130's, but she states that she has had some readings as high as 160 which have been associated with HA.  Urticaria- notes intermittent hives and dermatographia.   Epigastric chest pain- notes that this is a continued problem. + chest wall tenderness.  Allergies: 1)  ! Inderal 2)  ! * Nifedipine 3)  ! Lisinopril 4)  ! Hydrochlorothiazide 5)  ! Propranolol Hcl 6)  ! Metoprolol Succinate 7)  ! Verapamil 8)  ! * Topiramate 9)  ! * Metronidazole 10)  ! * Tetracycline 11)  ! * Amlodipine 12)  ! Prilosec (Omeprazole) 13)  ! Amoxicillin 14)  ! Hydrocodone 15)  ! Levaquin 16)  ! * Vicoprofen 17)  ! * Omeprazole  Physical Exam  General:  Well-developed,well-nourished,in no acute distress; alert,appropriate and cooperative throughout  examination Head:  Normocephalic and atraumatic without obvious abnormalities. No apparent alopecia or balding. Chest Wall:  + tenderness to palpation Lungs:  Normal respiratory effort, chest expands symmetrically. Lungs are clear to auscultation, no crackles or wheezes. Heart:  Normal rate and regular rhythm. S1 and S2 normal without gallop, murmur, click, rub or other extra sounds. Abdomen:  + epigastric tenderness to palpation   Skin:  No rashes or urticaria noted    Impression & Recommendations:  Problem # 1:  ESSENTIAL HYPERTENSION, BENIGN (ICD-401.1) Assessment Unchanged  Today my repeat BP in the room is 134/88.  Patient's pressure was then measured with her portable cuff 133/95.  I suspect that her cuff is part of the problem.  Will monitor for now, pt instructed to schedule f/u if she has another reading of sbp in the 160's  BP today: 126/90 Prior BP: 124/88 (03/12/2010)  Problem # 2:  URTICARIA, HX OF (ICD-V13.3) Assessment: New Pt is requesting allergy testing.  Will order environmental and food rast testing.  Orders: T-Allergy Profile Region II-[place], [place], [place], [place], [place] [phone]) T-Food Allergy Profile Specific IgE (86003/[id number])  Problem # 3:  GERD (ICD-530.81) Assessment: Deteriorated She continues with complaint of epigastric/atypical CP.  Has had extensive w/u including a neg stress echo in 10/11.  Will increase her nexium to two  times a day to see if this helps.   The following medications were removed from the medication list:    Sodium Bicarbonate 648 Mg Tabs (Sodium bicarbonate) .Marland Kitchen... 2 tabs by mouth daily Her updated medication list for this problem includes:    Nexium 40 Mg Cpdr (Esomeprazole magnesium) ..... One tablet by mouth daily    Nexium 40 Mg Cpdr (Esomeprazole magnesium) ..... One tab by mouth twice daily  Complete Medication List: 1)  Lorazepam 0.5 Mg Tabs (Lorazepam) .... Take 1 tablet by mouth four times a day 2)  Nexium 40 Mg Cpdr (Esomeprazole  magnesium) .... One tablet by mouth daily 3)  Vitamin B-12 500 Mcg Tabs (Cyanocobalamin) .... Take 1 tablet by mouth once a day. 4)  Calcium Carbonate 600 Mg Tabs (Calcium carbonate) .... Take 1 tablet by mouth two times a day. 5)  Vitamin E Complex 400 Unit Caps (Vitamin e) .... Take 2 capsule daily. 6)  Lamisil At 1 % Crea (Terbinafine hcl) .... Apply twice daily to rash 7)  Nexium 40 Mg Cpdr (Esomeprazole magnesium) .... One tab by mouth twice daily  Patient Instructions: 1)  You will be contacted about your apt with Dr. Antonietta Barcelona. 2)  Please increase your nexium to twice a day for one month, then if symptoms improved, you can drop back to once a month. 3)  Follow up in 2 months. Prescriptions: NEXIUM 40 MG CPDR (ESOMEPRAZOLE MAGNESIUM) one tab by mouth twice daily  #60 x 2   Entered and Authorized by:   Lemont Fillers FNP   Signed by:   Lemont Fillers FNP on 03/28/2010   Method used:   Electronically to        [facility] # [phone]* (retail)       [address]       [place], [place]  [address]       Ph: [phone]       Fax: [phone]   RxID:   [id number]    Orders Added: 1)  T-Allergy Profile Region II-[place], [place], [place], [place], [place] [2951] 2)  T-Food Allergy Profile Specific IgE [86003/[id number]] 3)  Est. Patient Level III [88416]    Current Allergies (reviewed today): ! INDERAL ! * NIFEDIPINE ! LISINOPRIL ! HYDROCHLOROTHIAZIDE ! PROPRANOLOL HCL ! METOPROLOL SUCCINATE ! VERAPAMIL ! * TOPIRAMATE ! * METRONIDAZOLE ! * TETRACYCLINE ! * AMLODIPINE ! PRILOSEC (OMEPRAZOLE) ! AMOXICILLIN ! HYDROCODONE ! LEVAQUIN ! * VICOPROFEN ! * OMEPRAZOLE

## 2010-04-10 NOTE — Progress Notes (Signed)
Summary: nexium prior auth, lab result  Phone Note Call from Patient   Caller: Patient Call For: Lemont Fillers FNP Summary of Call: Received message from pt that Nexium would require prior authorization. Received PA form from [employer] ph)[phone]  fax)[phone].  Case ID [id number]. Form completed and forwarded to Provider for signature. Pt requested a return call re: additional information. Left message on machine for pt to return my call. Nicki Guadalajara Fergerson CMA Duncan Dull)  March 31, 2010 10:58 AM   Follow-up for Phone Call        Pt states she only  has 2 days worth of nexium left. Currently out of samples in the office and pt is wondering what she can take until we receive determination from the ins. company re: prior auth of Nexium. Please advise. Nicki Guadalajara Fergerson CMA Duncan Dull)  March 31, 2010 2:33 PM   Additional Follow-up for Phone Call Additional follow up Details #1::        I would recommend that she  use pepcid over the counter in the meantime- 20mg  by mouth once daily. Let her know that her allergy testing shows a mild allergy to grass pollens.  I recommend that she use claritin 10mg  by mouth once daily as needed for allergy symptoms.   Additional Follow-up by: Lemont Fillers FNP,  March 31, 2010 8:02 PM    Additional Follow-up for Phone Call Additional follow up Details #2::    Received approval for Nexium from 03/10/10 to 03/31/11, notified [name] at [facility] [phone]. Left message on machine to return my call. Nicki Guadalajara Fergerson CMA Duncan Dull)  April 01, 2010 9:24 AM   Left detailed message on pt's home number of Nexium approval and allergy testing result. Advised pt to call if any questions. Nicki Guadalajara Fergerson CMA (AAMA)  April 01, 2010 3:20 PM

## 2010-04-14 ENCOUNTER — Telehealth: Payer: Self-pay | Admitting: Family

## 2010-04-15 ENCOUNTER — Encounter (INDEPENDENT_AMBULATORY_CARE_PROVIDER_SITE_OTHER): Payer: Self-pay | Admitting: *Deleted

## 2010-04-15 ENCOUNTER — Encounter: Payer: Self-pay | Admitting: Family

## 2010-04-15 ENCOUNTER — Ambulatory Visit (INDEPENDENT_AMBULATORY_CARE_PROVIDER_SITE_OTHER): Payer: BC Managed Care – PPO | Admitting: Family

## 2010-04-15 ENCOUNTER — Telehealth: Payer: Self-pay | Admitting: Family

## 2010-04-15 DIAGNOSIS — R1084 Generalized abdominal pain: Secondary | ICD-10-CM

## 2010-04-15 DIAGNOSIS — F411 Generalized anxiety disorder: Secondary | ICD-10-CM

## 2010-04-15 NOTE — Medication Information (Signed)
Summary: Nexium Approved  Nexium Approved   Imported By: Maryln Gottron 04/07/2010 09:46:45  _____________________________________________________________________  External Attachment:    Type:   Image     Comment:   External Document

## 2010-04-17 LAB — DIFFERENTIAL
Basophils Relative: 2 % — ABNORMAL HIGH (ref 0–1)
Lymphocytes Relative: 29 % (ref 12–46)
Lymphs Abs: 2 10*3/uL (ref 0.7–4.0)
Monocytes Relative: 7 % (ref 3–12)
Neutro Abs: 4.2 10*3/uL (ref 1.7–7.7)
Neutrophils Relative %: 61 % (ref 43–77)

## 2010-04-17 LAB — URINALYSIS, ROUTINE W REFLEX MICROSCOPIC
Bilirubin Urine: NEGATIVE
Glucose, UA: NEGATIVE mg/dL
Ketones, ur: NEGATIVE mg/dL
Nitrite: NEGATIVE
Protein, ur: NEGATIVE mg/dL
Urobilinogen, UA: 0.2 mg/dL (ref 0.0–1.0)
pH: 6.5 (ref 5.0–8.0)

## 2010-04-17 LAB — URINE MICROSCOPIC-ADD ON

## 2010-04-17 LAB — CBC
HCT: 38.7 % (ref 36.0–46.0)
MCHC: 34.4 g/dL (ref 30.0–36.0)
MCV: 90.9 fL (ref 78.0–100.0)
RDW: 12.2 % (ref 11.5–15.5)

## 2010-04-17 LAB — COMPREHENSIVE METABOLIC PANEL
BUN: 9 mg/dL (ref 6–23)
Calcium: 9.6 mg/dL (ref 8.4–10.5)
Creatinine, Ser: 0.6 mg/dL (ref 0.4–1.2)
Glucose, Bld: 76 mg/dL (ref 70–99)
Total Protein: 7.5 g/dL (ref 6.0–8.3)

## 2010-04-17 LAB — PREGNANCY, URINE: Preg Test, Ur: NEGATIVE

## 2010-04-21 LAB — CBC
HCT: 41.5 % (ref 36.0–46.0)
Hemoglobin: 13.9 g/dL (ref 12.0–15.0)
MCHC: 33.6 g/dL (ref 30.0–36.0)
MCV: 90.7 fL (ref 78.0–100.0)
Platelets: 185 K/uL (ref 150–400)
RBC: 4.57 MIL/uL (ref 3.87–5.11)
RDW: 12.2 % (ref 11.5–15.5)
WBC: 5.8 K/uL (ref 4.0–10.5)

## 2010-04-21 LAB — DIFFERENTIAL
Basophils Absolute: 0 K/uL (ref 0.0–0.1)
Basophils Relative: 0 % (ref 0–1)
Eosinophils Absolute: 0 K/uL (ref 0.0–0.7)
Eosinophils Relative: 0 % (ref 0–5)
Lymphocytes Relative: 38 % (ref 12–46)
Lymphs Abs: 2.2 K/uL (ref 0.7–4.0)
Monocytes Absolute: 0.5 10*3/uL (ref 0.1–1.0)
Monocytes Relative: 8 % (ref 3–12)
Neutro Abs: 3.1 10*3/uL (ref 1.7–7.7)
Neutrophils Relative %: 54 % (ref 43–77)

## 2010-04-21 LAB — BASIC METABOLIC PANEL
CO2: 24 mEq/L (ref 19–32)
Calcium: 9.9 mg/dL (ref 8.4–10.5)
Chloride: 108 mEq/L (ref 96–112)
GFR calc Af Amer: >60 mL/min (ref >60)
Sodium: 144 mEq/L (ref 135–145)

## 2010-04-21 LAB — BASIC METABOLIC PANEL WITH GFR
BUN: 13 mg/dL (ref 6–23)
Creatinine, Ser: 0.6 mg/dL (ref 0.4–1.2)
GFR calc non Af Amer: >60 mL/min (ref >60)
Glucose, Bld: 94 mg/dL (ref 70–99)
Potassium: 3.9 meq/L (ref 3.5–5.1)

## 2010-04-21 LAB — POCT CARDIAC MARKERS
CKMB, poc: <1 ng/mL — ABNORMAL LOW (ref 1.0–8.0)
Myoglobin, poc: 21.4 ng/mL (ref 12–200)
Troponin i, poc: <0.05 ng/mL (ref 0.00–0.09)

## 2010-04-21 LAB — D-DIMER, QUANTITATIVE: D-Dimer, Quant: <0.22 ug{FEU}/mL (ref 0.00–0.48)

## 2010-04-22 NOTE — Letter (Signed)
Summary: Primary Care Consult Scheduled Letter  [place] [facility]  [address]   [place], [place] [address]   Phone: [phone]  Fax: [phone]      04/15/2010 MRN: [id number]  [facility] [address] [place], [place]  [address]  [place]    Dear Ms. [facility],      We have scheduled an appointment for you.  At the recommendation of MELISSA O'SULLIVAN,FNP, we have scheduled you a consult with DR Antonietta Barcelona, on [facility] 23,2012 at 11:15AM.  Their address is_[address], [place] . The office phone number is [phone].  If this appointment day and time is not convenient for you, please feel free to call the office of the doctor you are being referred to at the number listed above and reschedule the appointment.     It is important for you to keep your scheduled appointments. We are here to make sure you are given good patient care.     Thank you, Darral Dash Patient Care Coordinator [place] at [facility]

## 2010-04-22 NOTE — Assessment & Plan Note (Signed)
Summary: follow up of dark stools, abdominal pain and labs--rm 5   Vital Signs:  Patient profile:   40 year old female Height:      63 inches Weight:      136 pounds BMI:     24.18 Temp:     98.2 degrees F oral Pulse rate:   72 / minute Pulse rhythm:   regular Resp:     16 per minute BP sitting:   120 / 80  (right arm) Cuff size:   regular  Vitals Entered By: Mervin Kung CMA Duncan Dull) (April 15, 2010 3:14 PM) CC: Pt has had abdominal pain and black stool since Saturday. Pt feels sick to her stomach. Is Patient Diabetic? No Pain Assessment Patient in pain? yes      Comments Pt is holding on her vitamins and nexium since the weekend. Has added Carafate.    Primary Care Provider:  Lemont Fillers FNP  CC:  Pt has had abdominal pain and black stool since Saturday. Pt feels sick to her stomach..  History of Present Illness: Natalie Cooley is a 40 year old female who presents today with chief complaint of abdominal pain.  Pt reports associated black colored stools.  She was seen at [facility] initially on 3/11on Sunday for same and was placed on Zantac and Carafate.  She was seen again last night and underwent UA which was negative.  She tells me that the stool was negative for blood.  Also had normal CBC, Lipase, chemistries, LFT's.  Pain is epigastric and radiates down both sides of her abdomen.  Feels like the carafate makes her hungry doesn' t feel that she is tolerating it well.  Mild improvement in her pain.  Last BM- today, dark but not as dark as it was on Sunday.  Denies frank blood in stool. + Nausea but no vomitting.  She feels like her symptoms worsened when she increased her   C/o frontal headache- has apt with Dr. Antonietta Barcelona  on 3/24.  Pt notes that she is often tearful during the day.  Notes that she has a lot of anxiety related to her experience during the war in [place].    Preventive Screening-Counseling & Management  Alcohol-Tobacco     Smoking Status:  current  Allergies: 1)  ! Inderal 2)  ! * Nifedipine 3)  ! Lisinopril 4)  ! Hydrochlorothiazide 5)  ! Propranolol Hcl 6)  ! Metoprolol Succinate 7)  ! Verapamil 8)  ! * Topiramate 9)  ! * Metronidazole 10)  ! * Tetracycline 11)  ! * Amlodipine 12)  ! Prilosec (Omeprazole) 13)  ! Amoxicillin 14)  ! Hydrocodone 15)  ! Levaquin 16)  ! * Vicoprofen 17)  ! * Omeprazole  Past History:  Past Medical History: Last updated: 06/12/2009 ESSENTIAL HYPERTENSION, BENIGN (ICD-401.1) CERVICALGIA (ICD-723.1) ANXIETY STATE, UNSPECIFIED (ICD-300.00) MIGRAINE HEADACHE (ICD-346.90) gyn Dr Silva Bandy  Past Surgical History: Last updated: 11/20/2009 myomectomy 10/11  Review of Systems       See HPI  Physical Exam  General:  Well-developed,well-nourished,in no acute distress; alert,appropriate and cooperative throughout examination Head:  Normocephalic and atraumatic without obvious abnormalities. No apparent alopecia or balding. Lungs:  Normal respiratory effort, chest expands symmetrically. Lungs are clear to auscultation, no crackles or wheezes. Heart:  Normal rate and regular rhythm. S1 and S2 normal without gallop, murmur, click, rub or other extra sounds. Abdomen:  + epigastric tenderness throughout abdomen.  Abdomen is soft, non-distended, no guarding or rebound tenderness,  +  bowel sounds.   Rectal:  No external abnormalities noted. Normal sphincter tone. No rectal masses or tenderness. Heme negative stool. Msk:  + tendereness to palpation over rib cage and back   Impression & Recommendations:  Problem # 1:  ABDOMINAL PAIN, GENERALIZED (ICD-789.07) Assessment Deteriorated Work up done at [facility] was negative.  Heme negative today.  Pt is noted to have diffuse tenderness- abdomen, rib cage, back. She seems not to be tolerating the increase in her nexium dose. Normal LFT's and no Gallstones noted on CT back in October, so I doubt GB is causing this problem. Recommended that she  stop nexium and continue zantac.  Keep follow up apt with GI.     Problem # 2:  ANXIETY STATE, UNSPECIFIED (ICD-300.00) Assessment: Comment Only Long discussion with the patient about her experience in [place], anxiety, tearfulness.  Suspect that she has some underlying depression as well.  In addition, she may have some PTSD.  She is followed by Dr.  Sandria Manly psychiatry and I have placed a call to him to discuss her case.  She seems to have a fair amount of somatization as well.  Also spoke with the patient about her home life.  She reports that she and her husband have a good relationship and the she has had no problems with her two children.  I recommended that she see a therapist.  She will consider this, but she is concerned about cost.  >25 minutes were spent with the patient today.  >50% of this time was spent counseling patient on her anxiety and depression. Her updated medication list for this problem includes:    Lorazepam 0.5 Mg Tabs (Lorazepam) .Marland Kitchen... Take 1 tablet by mouth four times a day  Complete Medication List: 1)  Lorazepam 0.5 Mg Tabs (Lorazepam) .... Take 1 tablet by mouth four times a day 2)  Nexium 40 Mg Cpdr (Esomeprazole magnesium) .... One tablet by mouth daily 3)  Vitamin B-12 500 Mcg Tabs (Cyanocobalamin) .... Take 1 tablet by mouth once a day. 4)  Calcium Carbonate 600 Mg Tabs (Calcium carbonate) .... Take 1 tablet by mouth two times a day. 5)  Vitamin E Complex 400 Unit Caps (Vitamin e) .... Take 2 capsule daily. 6)  Lamisil At 1 % Crea (Terbinafine hcl) .... Apply twice daily to rash 7)  Zantac 150 Maximum Strength 150 Mg Tabs (Ranitidine hcl) .... One tablet by mouth twice daily  Patient Instructions: 1)  Stop Nexium. 2)  Call if you develop worsening abdominal pain, vomitting. 3)  Keep your follow up apt with GI.   Orders Added: 1)  Est. Patient Level IV [65784]    Current Allergies (reviewed today): ! INDERAL ! * NIFEDIPINE ! LISINOPRIL !  HYDROCHLOROTHIAZIDE ! PROPRANOLOL HCL ! METOPROLOL SUCCINATE ! VERAPAMIL ! * TOPIRAMATE ! * METRONIDAZOLE ! * TETRACYCLINE ! * AMLODIPINE ! PRILOSEC (OMEPRAZOLE) ! AMOXICILLIN ! HYDROCODONE ! LEVAQUIN ! * VICOPROFEN ! * OMEPRAZOLE

## 2010-04-22 NOTE — Progress Notes (Signed)
Summary: f/u  Phone Note Call from Patient   Caller: Patient Call For: Lemont Fillers FNP Summary of Call: Spoke to pt and advised her of appt dates / times with neurology and GI. Pt states she went back to [facility] yesterday and had bloodwork done. She will call them to get results faxed to [place]. Pt scheduled appt with Langdon Crosson today at 3:15. Nicki Guadalajara Fergerson CMA Duncan Dull)  April 15, 2010 8:17 AM   Follow-up for Phone Call        Spoke to Lupe at [facility] [phone] and requested office note and labs from yesterday. Nicki [place] Fergerson CMA Duncan Dull)  April 15, 2010 8:23 AM   Additional Follow-up for Phone Call Additional follow up Details #1::        Labs and office note received and forwareded to Provider for review. Nicki Guadalajara Fergerson CMA Duncan Dull)  April 15, 2010 9:39 AM

## 2010-04-22 NOTE — Progress Notes (Signed)
Summary: GI and headache referrals  Phone Note Call from Patient   Caller: Patient Call For: Lemont Fillers FNP Summary of Call: Pt states she has had abdominal pain since taking the Nexium twice a week, med doesn't seem to be helping. Stools were dark green. Went to [facility] yesterday and he recommended that she see a Solicitor. Pt was prescribed Carafate 1gm four times a day. Pt would like [place] to refer her to a GI doctor. Advised pt we will need to get records from [facility] first [phone]). Spoke to [place] at [place] and she states she will fax the notes.  Pt also wanted to know the status of her referral to the headache clinic. She states she spoke to [place] about this at her last visit. Please advise. Initial call taken by: Mervin Kung CMA Duncan Dull),  April 14, 2010 8:26 AM  Follow-up for Phone Call        Received office note from [facility] and forwarded to you for review. Nicki [place] Fergerson CMA Duncan Dull)  April 14, 2010 1:54 PM   Additional Follow-up for Phone Call Additional follow up Details #1::        Please let patient know that I we are in the process of arranging f/u with Dr. Antonietta Barcelona (headache) and Dr. Georgiann Mohs (GI). Additional Follow-up by: Lemont Fillers FNP,  April 14, 2010 2:41 PM

## 2010-06-09 ENCOUNTER — Encounter: Payer: Self-pay | Admitting: Family

## 2010-06-26 ENCOUNTER — Encounter: Payer: Self-pay | Admitting: Family

## 2011-01-16 IMAGING — CT CT ABD-PELV W/O CM
2 of 4 series · 17 of 46 positions shown, 19 images · non-contrast
Comparison: None.

CLINICAL DATA: Left sided abdominal pain, some nausea, vomiting,
burning on urination, history of fibroids

CT ABDOMEN AND PELVIS WITHOUT CONTRAST
TECHNIQUE: Multidetector CT imaging of the abdomen and pelvis was
performed following the standard protocol without intravenous
contrast.

[Series 2: renal stone < 200 lbs 5.0 b31f · axial · 0.72mm/px · z∈[+825,+1195]mm · 14 of 82 slices shown, 16 images]
[im 4/82  soft-tissue]
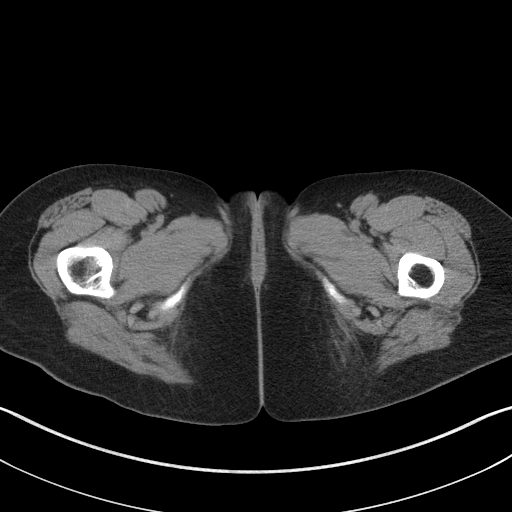
[im 4/82  bone]
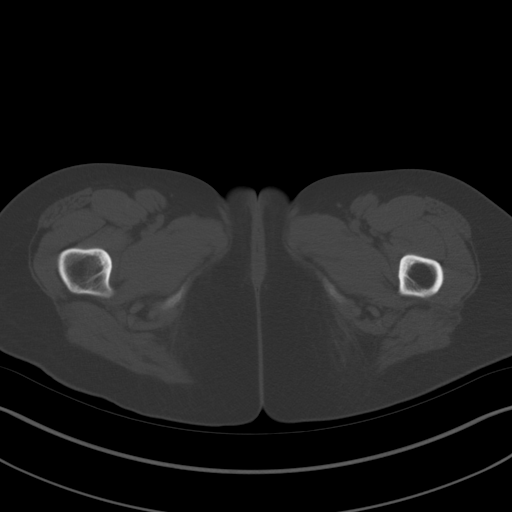
[im 10/82  soft-tissue]
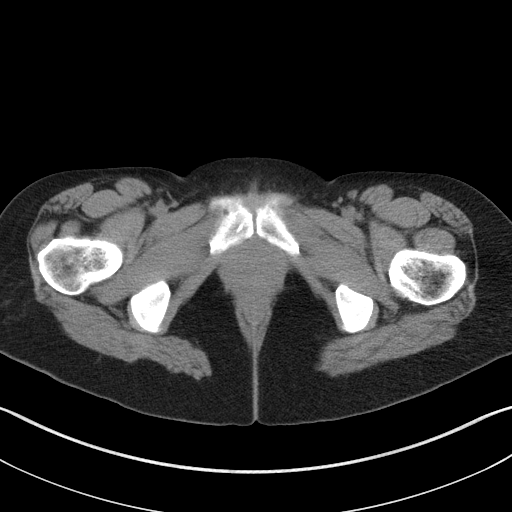
[im 17/82  soft-tissue]
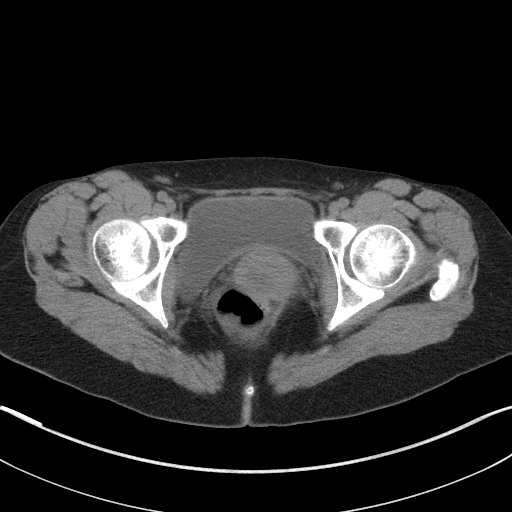
[im 23/82  soft-tissue]
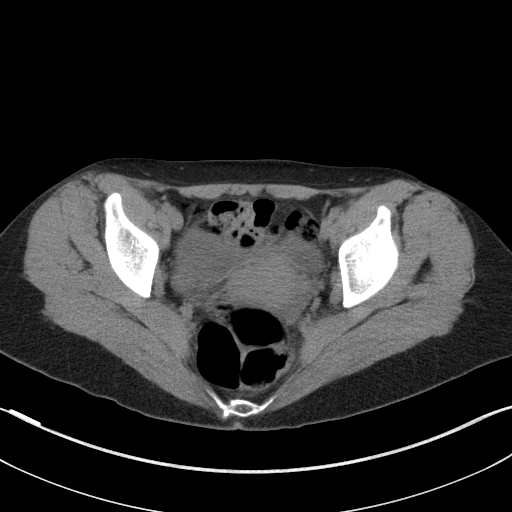
[im 26/82  soft-tissue]
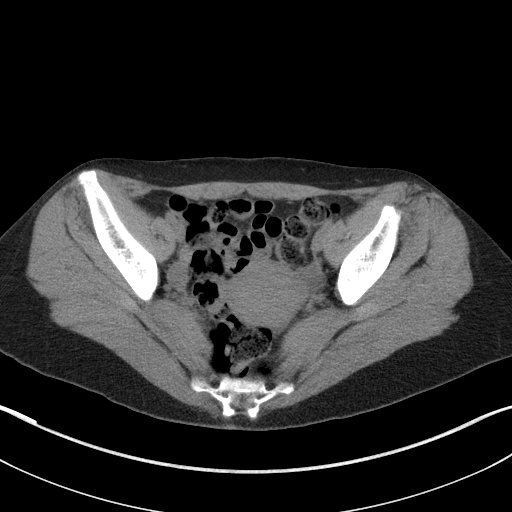
[im 33/82  soft-tissue]
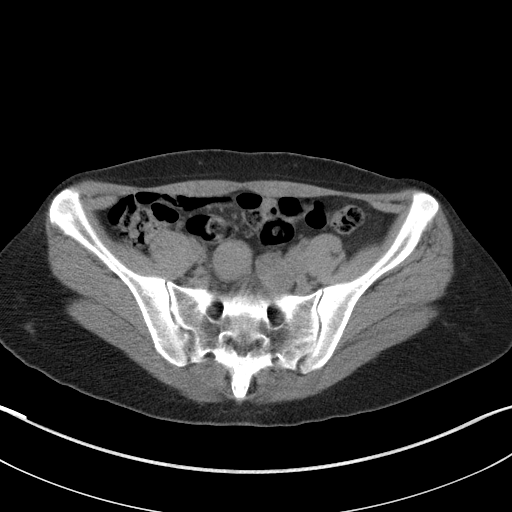
[im 39/82  soft-tissue]
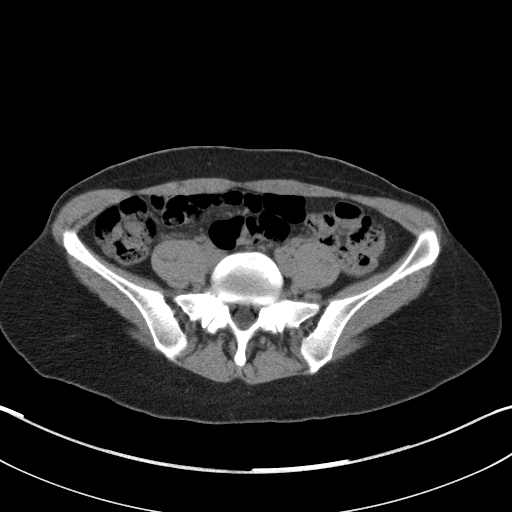
[im 43/82  soft-tissue]
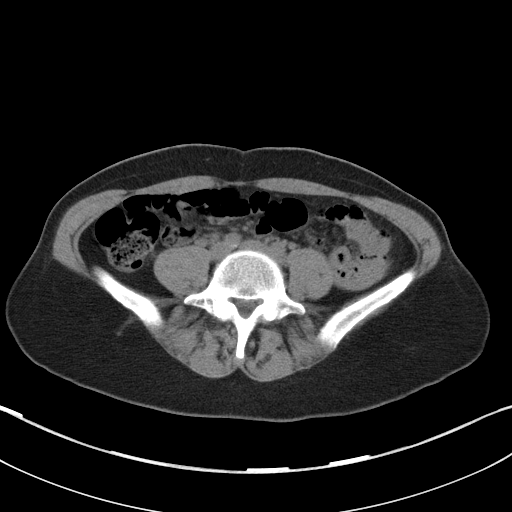
[im 49/82  soft-tissue]
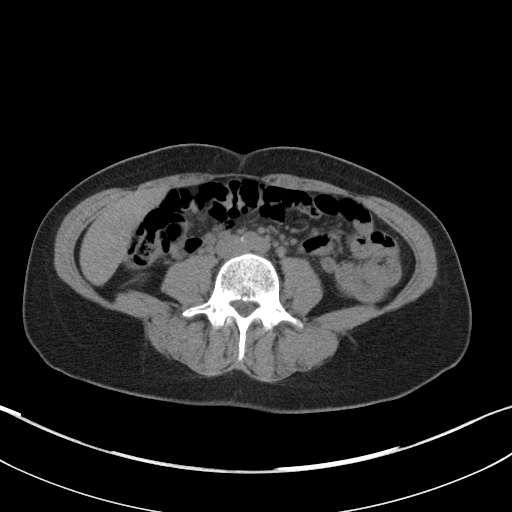
[im 49/82  bone]
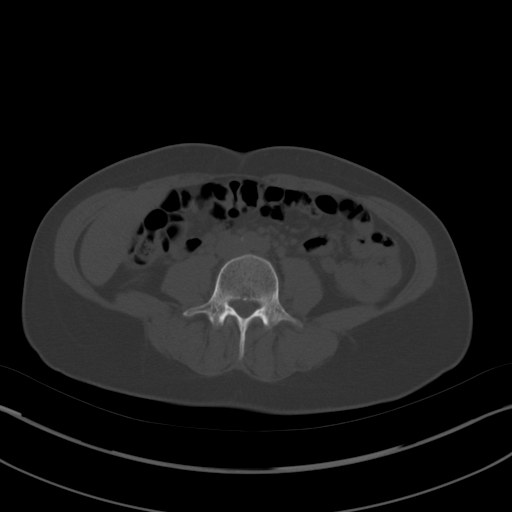
[im 56/82  soft-tissue]
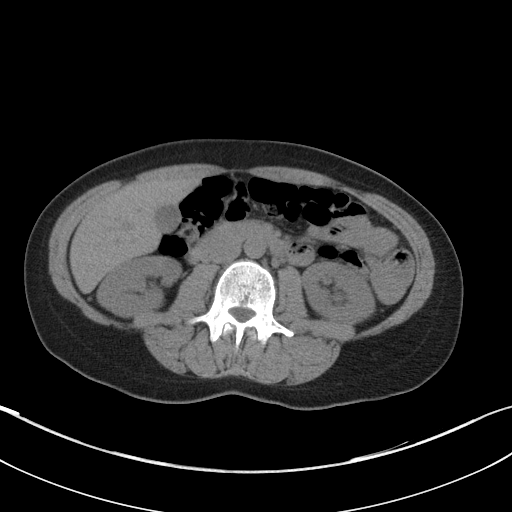
[im 62/82  soft-tissue]
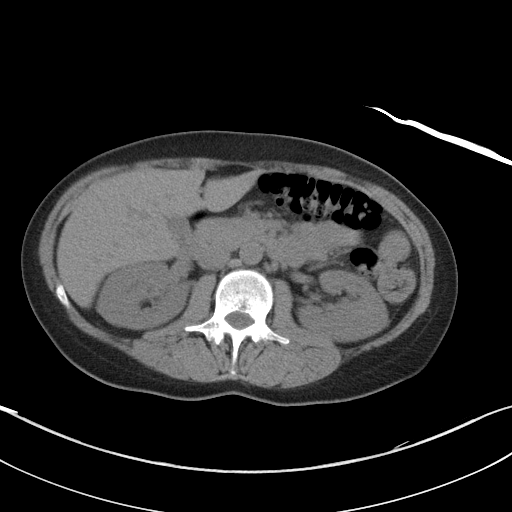
[im 65/82  soft-tissue]
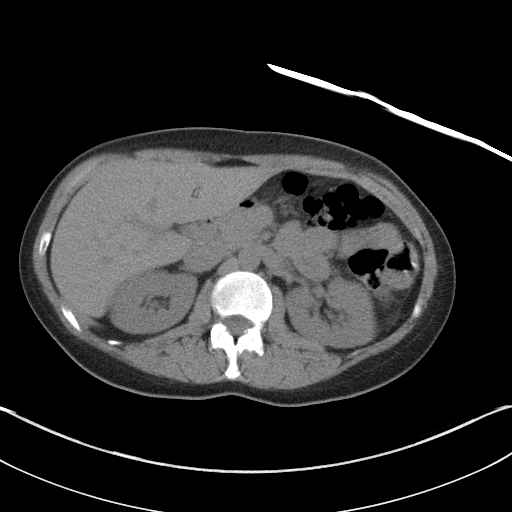
[im 72/82  soft-tissue]
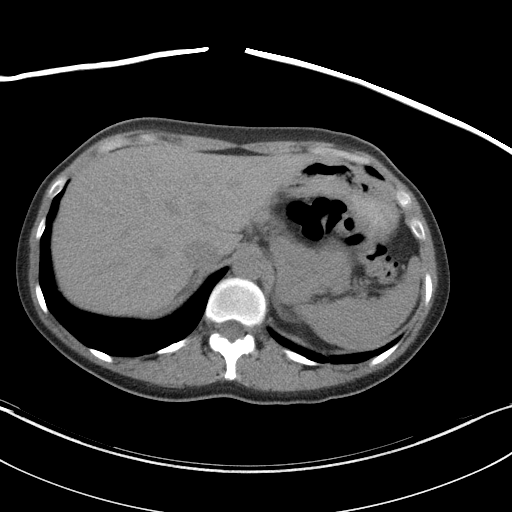
[im 78/82  soft-tissue]
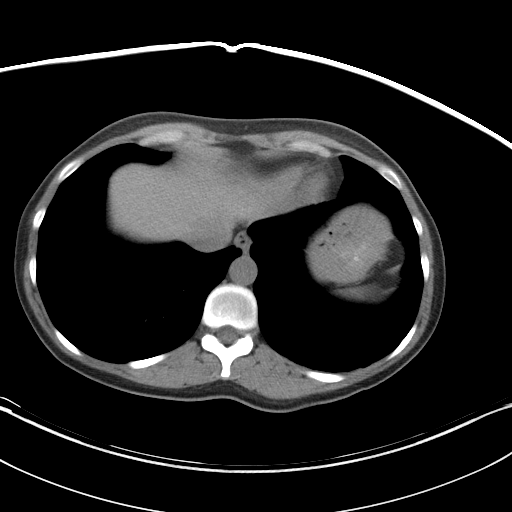

[Series 5: renal stone 3.0 coronal · coronal · 0.82mm/px · 3 of 65 slices shown]
[im 22/65  soft-tissue]
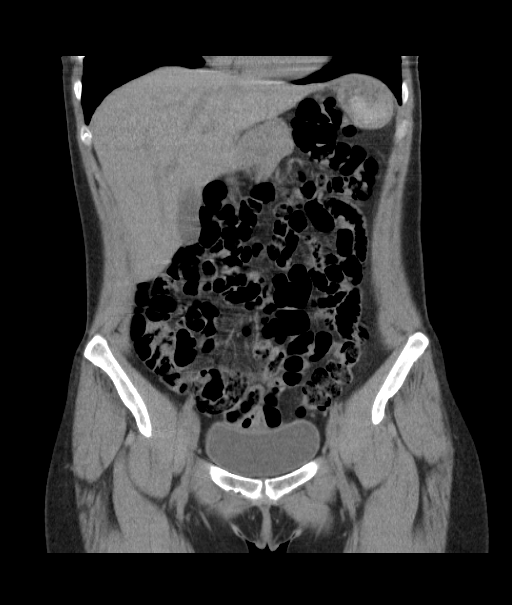
[im 29/65  soft-tissue]
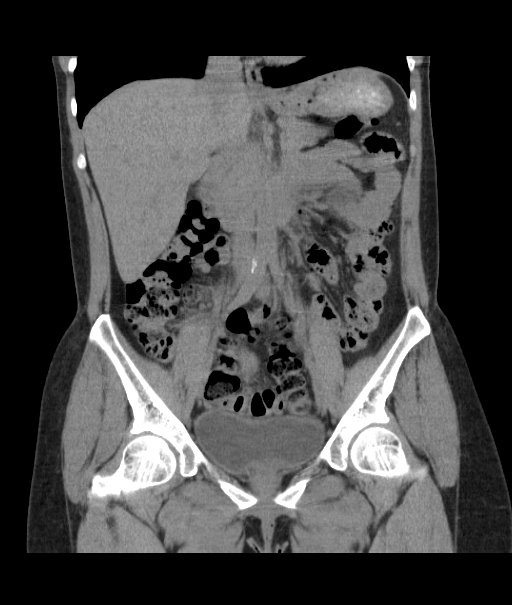
[im 36/65  soft-tissue]
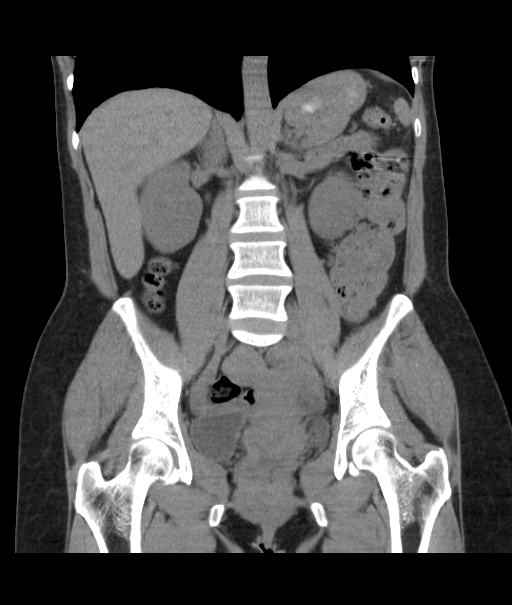

[17 of 46 positions shown; findings below may reference images not displayed]

FINDINGS: The lung bases are clear.  The liver is unremarkable in
the unenhanced state.  No calcified gallstones are seen.  The
pancreas is normal in size and the pancreatic duct is not dilated.
The adrenal glands and spleen are unremarkable.  The stomach is not
well distended.  No renal calculi are noted and there is no
evidence of hydronephrosis.  The abdominal aorta is normal in
caliber.

The uterus is lobular consistent with a pedunculated uterine
fibroid  anteriorly on the right measuring 34 x 37 mm.  No adnexal
lesion is seen.  The urinary bladder is unremarkable.  Only a tiny
amount of free fluid is noted the pelvis, probably physiologic.
The appendix is unremarkable.  No bony abnormality is seen.
IMPRESSION: Negative unenhanced CT of the abdomen and pelvis.  No acute
abnormality.  No renal calculi.  Pedunculated  uterine fibroid of
37 mm in maximum diameter.

## 2011-01-24 IMAGING — US US ABDOMEN COMPLETE
1 series · 14 of 25 positions shown · non-contrast
Comparison: CT 10/13/2009

CLINICAL DATA: Epigastric pain.  Hypertension.

COMPLETE ABDOMINAL ULTRASOUND

[Series 1: us abdomen complete · 0.26mm/px · 14 of 68 slices shown]
[im 1/68]
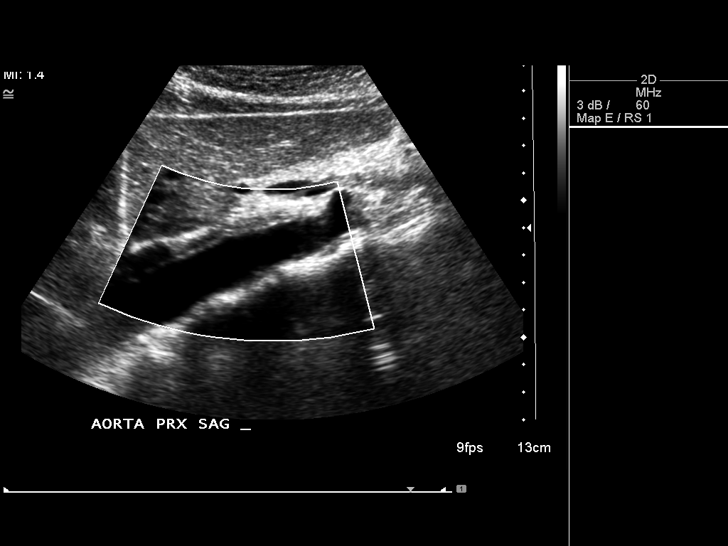
[im 6/68]
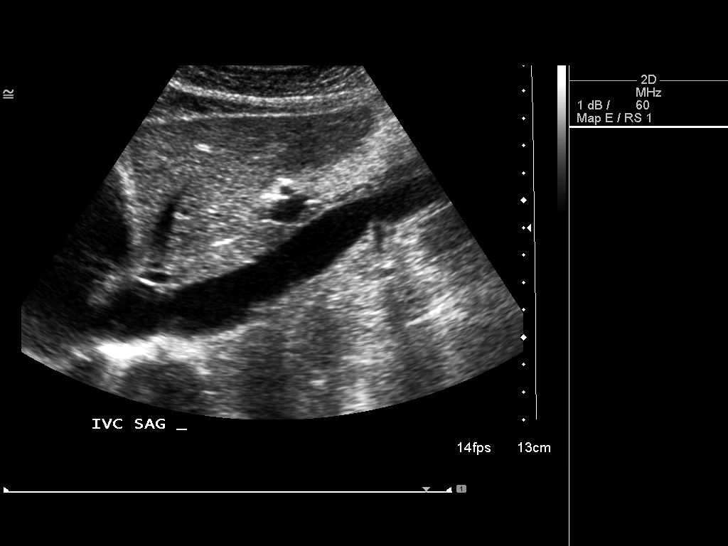
[im 12/68]
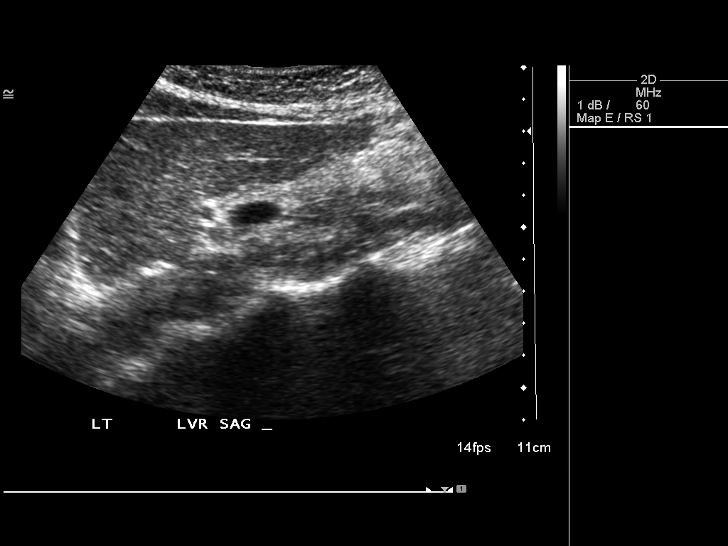
[im 17/68]
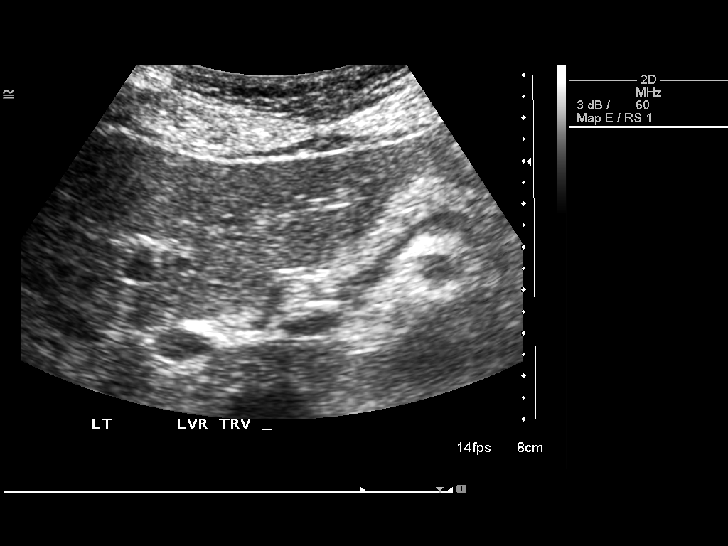
[im 23/68]
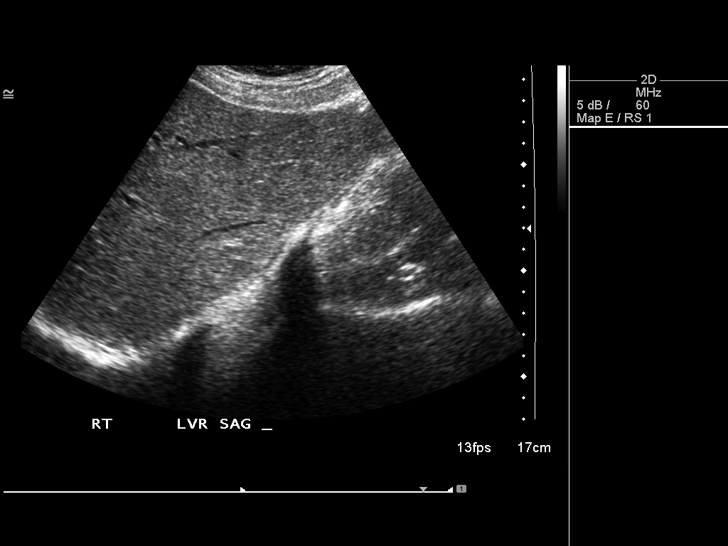
[im 26/68]
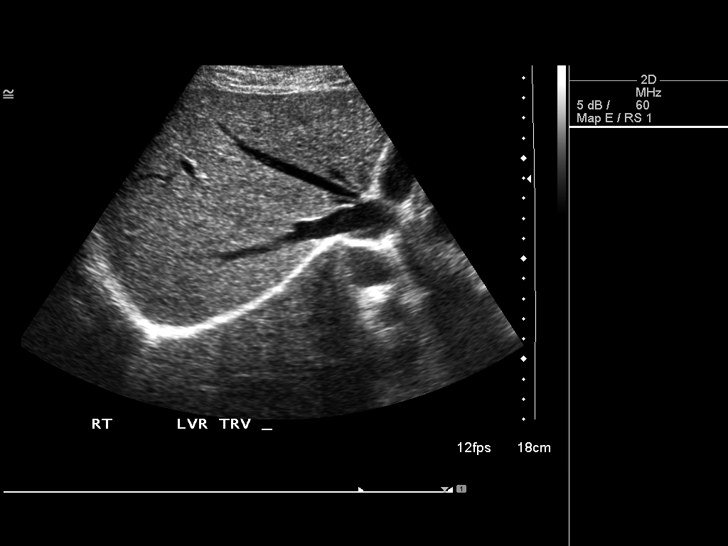
[im 31/68]
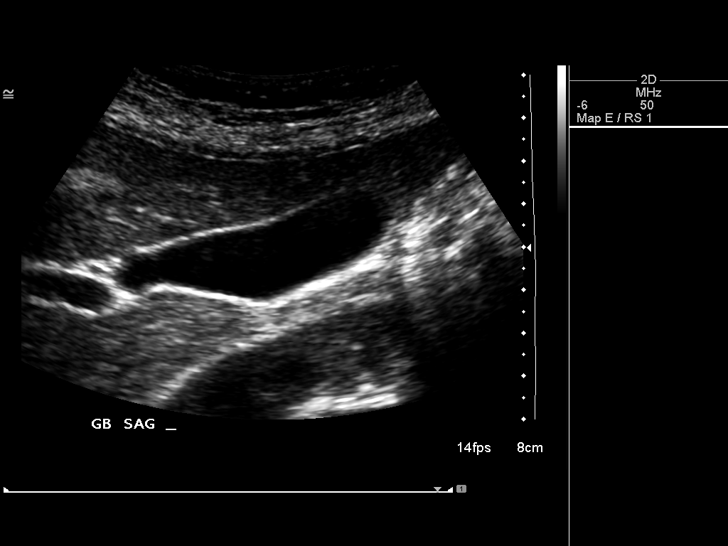
[im 37/68]
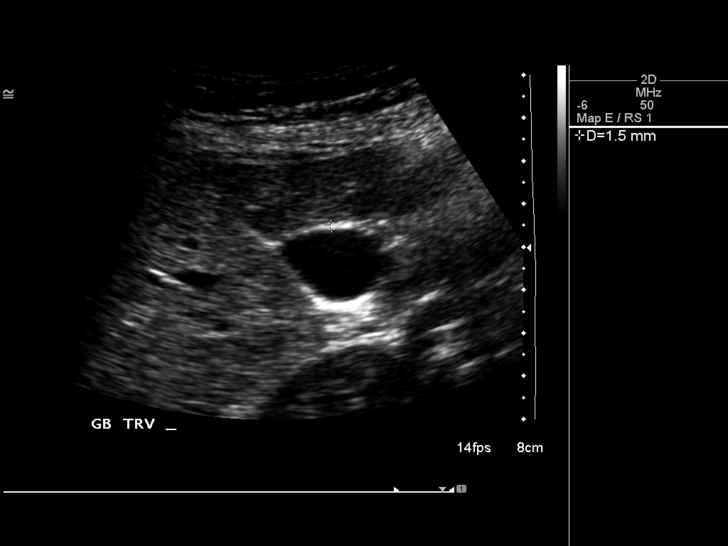
[im 42/68]
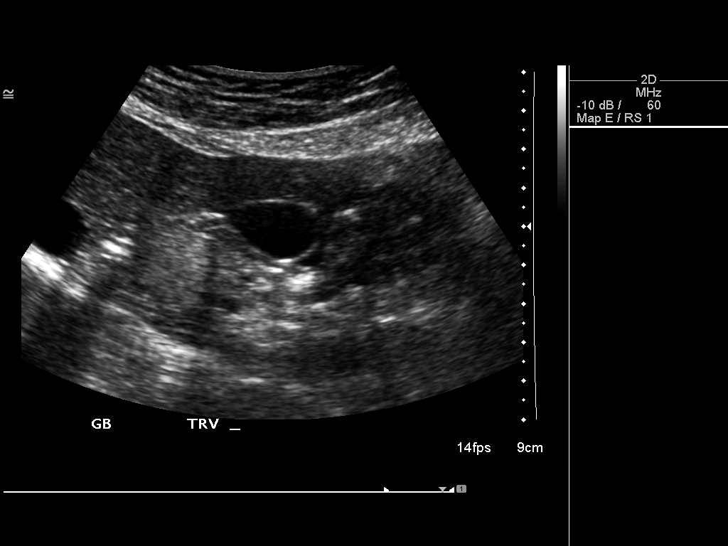
[im 45/68]
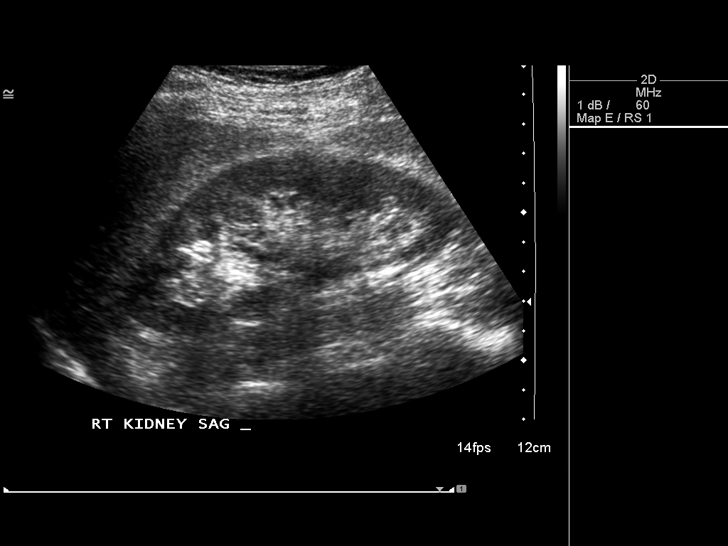
[im 51/68]
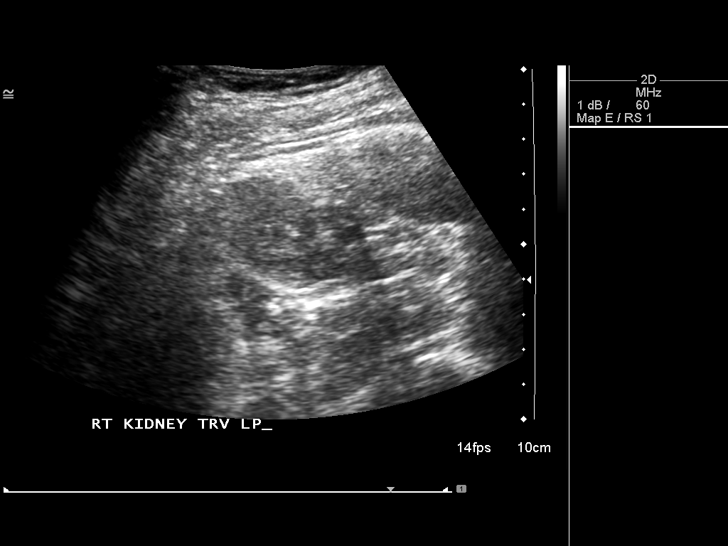
[im 56/68]
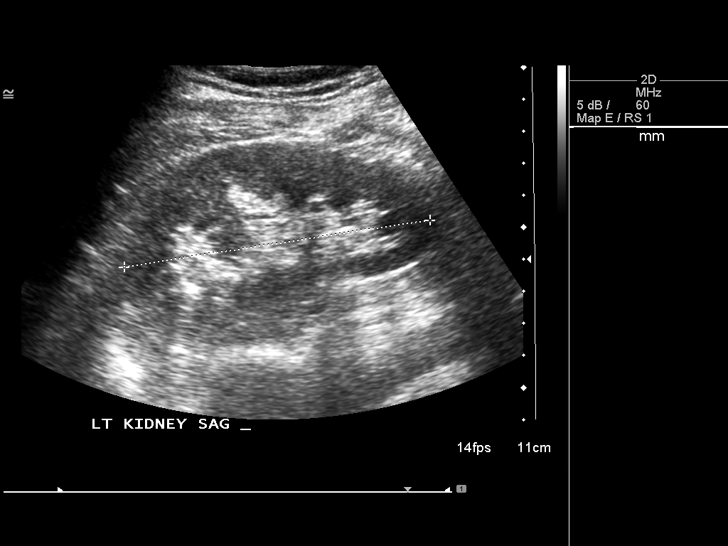
[im 62/68]
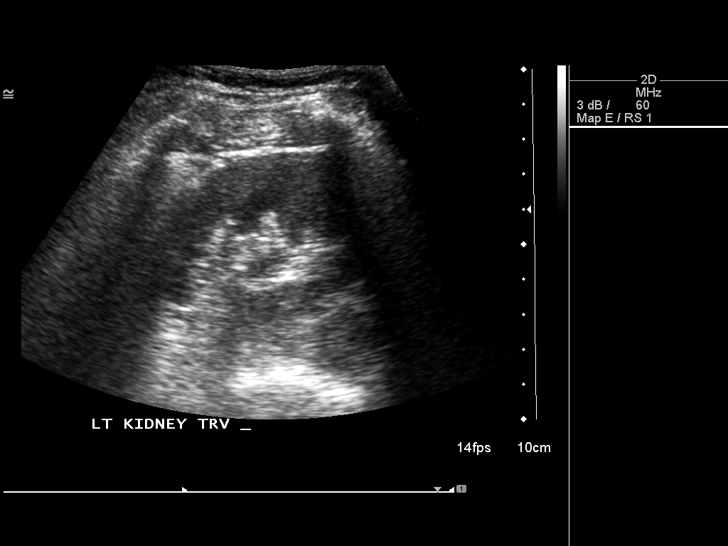
[im 68/68]
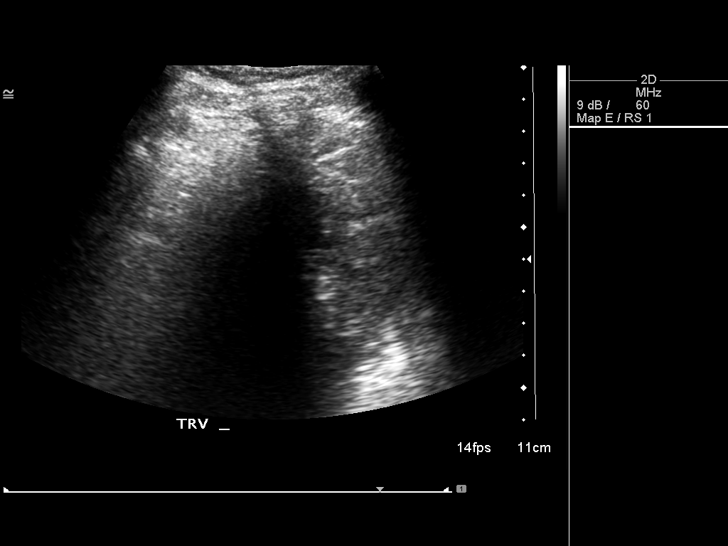

[14 of 25 positions shown; findings below may reference images not displayed]

FINDINGS: Gallbladder:  No gallstones, gallbladder wall thickening, or
pericholecystic fluid.

Common bile duct:  1.2 mm diameter, unremarkable

Liver:  No focal lesion identified.  Within normal limits in
parenchymal echogenicity.

IVC:  Appears normal.

Pancreas:  No focal abnormality seen.

Spleen:  8.3 cm craniocaudal length, unremarkable

Right Kidney:  11.3 cm. No hydronephrosis.  Well-preserved cortex.
Normal size and parenchymal echotexture without focal
abnormalities.

Left Kidney:  9.7 cm. No hydronephrosis.  Well-preserved cortex.
Normal size and parenchymal echotexture without focal
abnormalities.

Abdominal aorta:  No aneurysm identified.
IMPRESSION: Negative abdominal ultrasound.

## 2013-01-24 ENCOUNTER — Telehealth: Payer: Self-pay | Admitting: Family

## 2013-01-24 ENCOUNTER — Encounter: Payer: Self-pay | Admitting: Family

## 2013-01-24 ENCOUNTER — Ambulatory Visit (INDEPENDENT_AMBULATORY_CARE_PROVIDER_SITE_OTHER): Payer: Medicare Other | Admitting: Family

## 2013-01-24 VITALS — BP 140/100 | HR 78 | Temp 98.0°F | Resp 16 | Ht 64.0 in | Wt 144.1 lb

## 2013-01-24 DIAGNOSIS — I10 Essential (primary) hypertension: Secondary | ICD-10-CM

## 2013-01-24 DIAGNOSIS — R0789 Other chest pain: Secondary | ICD-10-CM

## 2013-01-24 DIAGNOSIS — R079 Chest pain, unspecified: Secondary | ICD-10-CM

## 2013-01-24 MED ORDER — HYDRALAZINE HCL 25 MG PO TABS: 25.0000 mg | ORAL_TABLET | Freq: Three times a day (TID) | ORAL | Status: AC

## 2013-01-24 NOTE — Progress Notes (Signed)
Pre visit review using our clinic review tool, if applicable. No additional management support is needed unless otherwise documented below in the visit note. 

## 2013-01-24 NOTE — Telephone Encounter (Signed)
Pls let pt know that I reviewed EKG with Dr. Jens Som and he thinks it is OK.

## 2013-01-24 NOTE — Assessment & Plan Note (Addendum)
Deteriorated.  Likely contributing to headache and dizziness. Trial of hydralazine.

## 2013-01-24 NOTE — Assessment & Plan Note (Signed)
Atypical CP. EKG notes short PRN interval otherwise unremarkable.  Reviewed with Dr. Jens Som- no further work up necessary Re: EKG.

## 2013-01-24 NOTE — Telephone Encounter (Signed)
Message copied by Sandford Craze on Tue Jan 24, 2013  1:26 PM ------      Message from: Lewayne Bunting      Created: Tue Jan 24, 2013  1:22 PM       No concerns; have a great Christmas!      Olga Millers            ----- Message -----         From: Sandford Craze, NP         Sent: 01/24/2013   1:10 PM           To: Lewayne Bunting, MD            Dr. Jens Som,            Would you mind looking at her EKG. She saw both of [place] about 3 yrs ago and lost to follow up.  Came in hypertensive with reproducible anterior chest wall tenderness.  Any concern from your end re:  Short PR?  I am happy to arrange follow up with you if you would like.      Hope you have a Merry Christmas!            Thanks,            Mieke Brinley       ------

## 2013-01-24 NOTE — Patient Instructions (Signed)
Please start hydralazine. Follow up in 2 weeks.

## 2013-01-24 NOTE — Progress Notes (Signed)
Subjective:    Patient ID: Natalie Cooley, female    DOB: July 26, 1970, 42 y.o.   MRN: [id number]  HPI  Natalie Cooley is a 41 yr old female who presents today to re-establish. She wishes to discuss hypertension. She has not been seen by our practice in >3 years.  She has not been seen in our office since 2011. She is not currently maintained on BP meds.  She has multiple drug intolerances.  Had cough with lisinopril, mouth was dry and "throat felt tight."   Has been told that her BP is running high by other providers she has seen.  Migraines- Reports HA, last Thursday and went to the ED.  Was told that she had migraine. Had CT scan and was clear at [facility].  She continues to see Dr. Antonietta Barcelona for headaches.  She is using zanaflex for HA prevention.  ED gave her Butalbital and a prednisone taper for her migraine.    She reports that she has chest pain when her BP is up.  Chest pain is worsened by SOB.    Review of Systems    see HPI  Past Medical History  Diagnosis Date  . Essential hypertension, benign   . Cervicalgia   . Migraine, unspecified, without mention of intractable migraine without mention of status migrainosus   . Anxiety state, unspecified     History   Social History  . Marital Status: Married    Spouse Name: N/A    Number of Children: 2  . Years of Education: N/A   Occupational History  . CUSTODIAN     [employer]   Social History Main Topics  . Smoking status: Current Every Day Smoker  . Smokeless tobacco: Not on file     Comment: 1 cigarette daily  . Alcohol Use: No  . Drug Use: No  . Sexual Activity: Not on file   Other Topics Concern  . Not on file   Social History Narrative   Moved from [place] in 2001          Past Surgical History  Procedure Laterality Date  . Myomectomy  10/11    No family history on file.  Allergies  Allergen Reactions  . Amoxicillin   . Hydrochlorothiazide     REACTION: headaches and Sob  . Hydrocodone   .  Hydrocodone-Ibuprofen   . Levofloxacin   . Lisinopril     REACTION: headaches  . Metoprolol Succinate     REACTION: tongue swelling / headaches  . Metronidazole     REACTION: feet swelling, shortness of breath  . Nifedipine     REACTION: headaches-tonge , face swells  . Omeprazole   . Propranolol Hcl     REACTION: headache- tonge swells  . Tetracycline     REACTION: feet swelling, shortness of breath  . Topiramate   . Verapamil     REACTION: headaches, dizzyness    Current Outpatient Prescriptions on File Prior to Visit  Medication Sig Dispense Refill  . LORazepam (ATIVAN) 0.5 MG tablet Take 0.5 mg by mouth 4 (four) times daily.         No current facility-administered medications on file prior to visit.    BP 140/100  Pulse 78  Temp(Src) 98 F (36.7 C) (Oral)  Resp 16  Ht 5\' 4"  (1.626 m)  Wt 144 lb 1.9 oz (65.372 kg)  BMI 24.73 kg/m2  SpO2 99%    Objective:   Physical Exam  Constitutional: She is  oriented to person, place, and time. She appears well-developed and well-nourished. No distress.  HENT:  Head: Normocephalic and atraumatic.  Cardiovascular: Normal rate and regular rhythm.   No murmur heard. Pulmonary/Chest: Effort normal and breath sounds normal. No respiratory distress. She has no wheezes. She has no rales. She exhibits no tenderness.  Musculoskeletal: She exhibits no edema.  + anterior chest wall tenderness to palpation  Neurological: She is alert and oriented to person, place, and time.  Psychiatric: She has a normal mood and affect. Her behavior is normal. Judgment and thought content normal.          Assessment & Plan:

## 2013-01-24 NOTE — Telephone Encounter (Signed)
Notified pt and she voices understanding. 

## 2013-01-27 ENCOUNTER — Telehealth: Payer: Self-pay | Admitting: Family

## 2013-01-27 MED ORDER — HYDRALAZINE HCL 10 MG PO TABS: 10.0000 mg | ORAL_TABLET | Freq: Three times a day (TID) | ORAL | Status: DC

## 2013-01-27 NOTE — Telephone Encounter (Signed)
Pt informed and states she has an appt on Jan 5, 15

## 2013-01-27 NOTE — Telephone Encounter (Signed)
STARTED TAKING THE HYDRALAZINE 25MG  3 PER DAY.  SHE STARTED ON Tuesday.  SHE HAS HAD A AWFUL HEADACHE DRY MOUTH NAUSEA.  SHE NEEDS TO KNOW IF SHE SHOULD STOP THE MEDICINE

## 2013-01-27 NOTE — Telephone Encounter (Signed)
Send in a new rx for a lower strength Hydralazine 10 mg tab po tid , disp #90 and bring her in for BP check next week

## 2013-02-06 ENCOUNTER — Encounter: Payer: Self-pay | Admitting: Family

## 2013-02-06 ENCOUNTER — Ambulatory Visit (INDEPENDENT_AMBULATORY_CARE_PROVIDER_SITE_OTHER): Payer: Medicare Other | Admitting: Family

## 2013-02-06 VITALS — BP 140/90 | HR 81 | Temp 98.1°F | Resp 16 | Ht 64.0 in | Wt 146.0 lb

## 2013-02-06 DIAGNOSIS — I10 Essential (primary) hypertension: Secondary | ICD-10-CM

## 2013-02-06 NOTE — Patient Instructions (Signed)
Please work on low fat diet, exercise, weight loss. Follow up in 1 month.

## 2013-02-06 NOTE — Progress Notes (Signed)
Pre visit review using our clinic review tool, if applicable. No additional management support is needed unless otherwise documented below in the visit note. 

## 2013-02-06 NOTE — Progress Notes (Signed)
Subjective:    Patient ID: Natalie Cooley, female    DOB: 04-06-1970, 43 y.o.   MRN: [id number]  HPI  [name] is a 43 yr old female who presents today for follow up of her HTN. She has multiple drug intolerances. Last visit she was started on Hydralazine.  She developed HA and hydralazine dose was decreased.  She reports HA persisted on reduced dose hydralazine.  She ultimately discontinued the hydralazine and HA resolved.   BP Readings from Last 3 Encounters:  02/06/13 140/90  01/24/13 140/100  04/15/10 120/80   Review of Systems See HPI    Past Medical History  Diagnosis Date  . Essential hypertension, benign   . Cervicalgia   . Migraine, unspecified, without mention of intractable migraine without mention of status migrainosus   . Anxiety state, unspecified     History   Social History  . Marital Status: Married    Spouse Name: N/A    Number of Children: 2  . Years of Education: N/A   Occupational History  . [place] History Main Topics  . Smoking status: Current Every Day Smoker  . Smokeless tobacco: Not on file     Comment: 1 cigarette daily  . Alcohol Use: No  . Drug Use: No  . Sexual Activity: Not on file   Other Topics Concern  . Not on file   Social History Narrative   Moved from [place] in 2001          Past Surgical History  Procedure Laterality Date  . Myomectomy  10/11    No family history on file.  Allergies  Allergen Reactions  . Amoxicillin   . Hydralazine Other (See Comments)    N/V, headache, tingling of tongue.  Marland Kitchen Hydrochlorothiazide     REACTION: headaches and Sob  . Hydrocodone   . Hydrocodone-Ibuprofen   . Levofloxacin   . Lisinopril     REACTION: headaches Throat tight cough  . Metoprolol Succinate     REACTION: tongue swelling / headaches  . Metronidazole     REACTION: feet swelling, shortness of breath  . Nifedipine     REACTION: headaches-tonge , face swells  . Omeprazole   .  Propranolol Hcl     REACTION: headache- tonge swells  . Tetracycline     REACTION: feet swelling, shortness of breath  . Topiramate   . Verapamil     REACTION: headaches, dizzyness    Current Outpatient Prescriptions on File Prior to Visit  Medication Sig Dispense Refill  . butalbital-acetaminophen-caffeine (FIORICET, ESGIC) 50-325-40 MG per tablet Take 1 tablet by mouth every 6 (six) hours as needed for headache.      Marland Kitchen LORazepam (ATIVAN) 0.5 MG tablet Take 0.5 mg by mouth 4 (four) times daily.        . sertraline (ZOLOFT) 25 MG tablet Take 12.5 mg by mouth at bedtime.      Marland Kitchen tiZANidine (ZANAFLEX) 4 MG tablet Take 4 mg by mouth 3 (three) times daily as needed for muscle spasms.      . hydrALAZINE (APRESOLINE) 25 MG tablet Take 1 tablet (25 mg total) by mouth 3 (three) times daily.  90 tablet  0   No current facility-administered medications on file prior to visit.    BP 140/90  Pulse 81  Temp(Src) 98.1 F (36.7 C) (Oral)  Resp 16  Ht 5\' 4"  (1.626 m)  Wt 146 lb (66.225 kg)  BMI  25.05 kg/m2  SpO2 99%    Objective:   Physical Exam  Constitutional: She is oriented to person, place, and time. She appears well-developed and well-nourished. No distress.  HENT:  Head: Normocephalic and atraumatic.  Cardiovascular: Normal rate and regular rhythm.   No murmur heard. Pulmonary/Chest: Effort normal and breath sounds normal. No respiratory distress. She has no wheezes. She has no rales. She exhibits no tenderness.  Musculoskeletal: She exhibits no edema.  Neurological: She is alert and oriented to person, place, and time.  Psychiatric: She has a normal mood and affect. Her behavior is normal. Judgment and thought content normal.          Assessment & Plan:

## 2013-02-08 DIAGNOSIS — I10 Essential (primary) hypertension: Secondary | ICD-10-CM | POA: Insufficient documentation

## 2013-02-08 NOTE — Assessment & Plan Note (Addendum)
BP is better today than last visit, still borderline. She reports that she eats a low sodium diet.  Body mass index is 25.05 kg/(m^2). She is just slightly overweight, however she has gained weight over the last few years and her blood pressure has risen.   Wt Readings from Last 3 Encounters:  02/06/13 146 lb (66.225 kg)  01/24/13 144 lb 1.9 oz (65.372 kg)  04/15/10 136 lb (61.689 kg)   We did discuss attempt at losing 5-10 pounds through portion control and exercise.  Will repeat BP in 1 month.  If BP worsens, consider trial of HCTZ. She has tried this in the past and cannot remember having any adverse side effects. 15 minutes spent with pt today. >50% of this time was spent counseling pt on HTN, diet, weight loss.

## 2013-03-20 ENCOUNTER — Ambulatory Visit: Payer: Medicare Other | Admitting: Family

## 2013-04-03 ENCOUNTER — Encounter: Payer: Self-pay | Admitting: Family

## 2013-04-03 ENCOUNTER — Ambulatory Visit (INDEPENDENT_AMBULATORY_CARE_PROVIDER_SITE_OTHER): Payer: Medicare Other | Admitting: Family

## 2013-04-03 VITALS — BP 130/84 | HR 77 | Temp 97.7°F | Resp 16 | Ht 64.0 in | Wt 147.1 lb

## 2013-04-03 DIAGNOSIS — M549 Dorsalgia, unspecified: Secondary | ICD-10-CM

## 2013-04-03 DIAGNOSIS — I10 Essential (primary) hypertension: Secondary | ICD-10-CM

## 2013-04-03 MED ORDER — KETOROLAC TROMETHAMINE 30 MG/ML IJ SOLN
60.0000 mg | Freq: Once | INTRAMUSCULAR | Status: AC
  Administered 2013-04-03: 60 mg via INTRAMUSCULAR

## 2013-04-03 NOTE — Progress Notes (Signed)
Subjective:    Patient ID: Natalie Cooley, female    DOB: September 24, 1970, 43 y.o.   MRN: [id number]  HPI  Natalie Cooley is a 43 yr old female who presents today for follow up. Reports that she is watching her food intake and walking regularly.  She is currently off of BP meds.    Review of Systems See HPI  Past Medical History  Diagnosis Date  . Essential hypertension, benign   . Cervicalgia   . Migraine, unspecified, without mention of intractable migraine without mention of status migrainosus   . Anxiety state, unspecified     History   Social History  . Marital Status: Married    Spouse Name: N/A    Number of Children: 2  . Years of Education: N/A   Occupational History  . [place] History Main Topics  . Smoking status: Current Every Day Smoker  . Smokeless tobacco: Not on file     Comment: 1 cigarette daily  . Alcohol Use: No  . Drug Use: No  . Sexual Activity: Not on file   Other Topics Concern  . Not on file   Social History Narrative   Moved from [place] in 2001          Past Surgical History  Procedure Laterality Date  . Myomectomy  10/11    No family history on file.  Allergies  Allergen Reactions  . Amoxicillin   . Hydralazine Other (See Comments)    N/V, headache, tingling of tongue.  Marland Kitchen Hydrochlorothiazide     REACTION: headaches and Sob  . Hydrocodone   . Hydrocodone-Ibuprofen   . Levofloxacin   . Lisinopril     REACTION: headaches Throat tight cough  . Metoprolol Succinate     REACTION: tongue swelling / headaches  . Metronidazole     REACTION: feet swelling, shortness of breath  . Nifedipine     REACTION: headaches-tonge , face swells  . Omeprazole   . Propranolol Hcl     REACTION: headache- tonge swells  . Tetracycline     REACTION: feet swelling, shortness of breath  . Topiramate   . Verapamil     REACTION: headaches, dizzyness    Current Outpatient Prescriptions on File Prior to Visit    Medication Sig Dispense Refill  . butalbital-acetaminophen-caffeine (FIORICET, ESGIC) 50-325-40 MG per tablet Take 1 tablet by mouth every 6 (six) hours as needed for headache.      . hydrALAZINE (APRESOLINE) 25 MG tablet Take 1 tablet (25 mg total) by mouth 3 (three) times daily.  90 tablet  0  . LORazepam (ATIVAN) 0.5 MG tablet Take 0.5 mg by mouth 4 (four) times daily.        . sertraline (ZOLOFT) 25 MG tablet Take 12.5 mg by mouth at bedtime.      Marland Kitchen tiZANidine (ZANAFLEX) 4 MG tablet Take 4 mg by mouth 3 (three) times daily as needed for muscle spasms.       No current facility-administered medications on file prior to visit.    BP 130/84  Pulse 77  Temp(Src) 97.7 F (36.5 C) (Oral)  Resp 16  Ht 5\' 4"  (1.626 m)  Wt 147 lb 1.3 oz (66.715 kg)  BMI 25.23 kg/m2  SpO2 99%       Objective:   Physical Exam  Constitutional: She is oriented to person, place, and time. She appears well-developed and well-nourished. No distress.  Cardiovascular: Normal rate and  regular rhythm.   No murmur heard. Neurological: She is alert and oriented to person, place, and time.  Psychiatric: She has a normal mood and affect. Her behavior is normal. Judgment and thought content normal.          Assessment & Plan:

## 2013-04-03 NOTE — Progress Notes (Signed)
Pre visit review using our clinic review tool, if applicable. No additional management support is needed unless otherwise documented below in the visit note. 

## 2013-04-03 NOTE — Patient Instructions (Signed)
Please follow up with Dr. Everette Rank as scheduled. Follow up with [place] in 3 months.

## 2013-04-04 ENCOUNTER — Telehealth: Payer: Self-pay | Admitting: Family

## 2013-04-04 NOTE — Assessment & Plan Note (Signed)
BP Readings from Last 3 Encounters:  04/03/13 130/84  02/06/13 140/90  01/24/13 140/100   BP looks good today. Continue low sodium diet and exercise. OK to remain off of BP meds for now.  Follow up in 3 months.

## 2013-04-04 NOTE — Telephone Encounter (Signed)
Relevant patient education mailed to patient.  

## 2023-03-23 ENCOUNTER — Other Ambulatory Visit: Payer: Self-pay

## 2023-03-23 ENCOUNTER — Ambulatory Visit
Admission: EM | Admit: 2023-03-23 | Discharge: 2023-03-23 | Disposition: A | Discharge status: Home / Self Care | Payer: 59 | Attending: Family Medicine | Admitting: Family Medicine

## 2023-03-23 DIAGNOSIS — J3489 Other specified disorders of nose and nasal sinuses: Secondary | ICD-10-CM

## 2023-03-23 DIAGNOSIS — J0100 Acute maxillary sinusitis, unspecified: Secondary | ICD-10-CM

## 2023-03-23 MED ORDER — CEFDINIR 300 MG PO CAPS: 300.0000 mg | ORAL_CAPSULE | Freq: Two times a day (BID) | ORAL | 0 refills | Status: AC

## 2023-03-23 MED ORDER — PREDNISONE 20 MG PO TABS
ORAL_TABLET | ORAL | 0 refills | Status: AC
Start: 2023-03-23 — End: ?

## 2023-03-23 NOTE — ED Triage Notes (Signed)
 Pt presents to [facility] with co of facial pain and tenderness dizziness,headache abd pain and chest pain since last week.pt reports motirn and tyelnol otc.

## 2023-03-23 NOTE — ED Provider Notes (Signed)
 [facility]    CSN: [id number] Arrival date & time: 03/23/23  0921      History   Chief Complaint Chief Complaint  Patient presents with   Facial Pain   Headache    HPI Natalie Cooley is a 53 y.o. female.   HPI 53 year old female presents with chest pain, headache, facial pain dizziness, and abdominal pain.  PMH significant for fibromyalgia, HTN, dizziness, chest pain, and abdominal pain generalized.  Past Medical History:  Diagnosis Date   Anxiety state, unspecified    Cervicalgia    Essential hypertension, benign    Migraine, unspecified, without mention of intractable migraine without mention of status migrainosus     Patient Active Problem List   Diagnosis Date Noted   HTN (hypertension) 02/08/2013   Accelerated hypertension 01/24/2013   History of disease of skin and subcutaneous tissue 03/28/2010   Dermatophytosis 03/12/2010   GERD 03/12/2010   DIZZINESS 03/12/2010   FREQUENCY, URINARY 03/12/2010   FIBROMYALGIA 11/20/2009   EPIGASTRIC PAIN 10/21/2009   FIBROIDS, UTERUS 07/29/2009   DEGENERATIVE DISC DISEASE 07/29/2009   FATIGUE 07/24/2009   CHEST PAIN 06/12/2009   CERVICALGIA 05/15/2009   Anxiety state 04/30/2009   Migraine headache 04/30/2009   ESSENTIAL HYPERTENSION, BENIGN 04/30/2009   PALPITATIONS 04/30/2009   ABDOMINAL PAIN, GENERALIZED 04/30/2009    Past Surgical History:  Procedure Laterality Date   MYOMECTOMY  10/11    OB History   No obstetric history on file.      Home Medications    Prior to Admission medications   Medication Sig Start Date End Date Taking? Authorizing Provider  cefdinir (OMNICEF) 300 MG capsule Take 1 capsule (300 mg total) by mouth 2 (two) times daily for 7 days. 03/23/23 03/30/23 Yes Trevor Iha, FNP  predniSONE (DELTASONE) 20 MG tablet Take 3 tabs PO daily x 5 days. 03/23/23  Yes Trevor Iha, FNP  butalbital-acetaminophen-caffeine (FIORICET, ESGIC) 50-325-40 MG per tablet Take 1 tablet by mouth  every 6 (six) hours as needed for headache.    [provider]  hydrALAZINE (APRESOLINE) 25 MG tablet Take 1 tablet (25 mg total) by mouth 3 (three) times daily. 01/24/13   Sandford Craze, NP  LORazepam (ATIVAN) 0.5 MG tablet Take 0.5 mg by mouth 4 (four) times daily.      [provider]  sertraline (ZOLOFT) 25 MG tablet Take 12.5 mg by mouth at bedtime.    [provider]  tiZANidine (ZANAFLEX) 4 MG tablet Take 4 mg by mouth 3 (three) times daily as needed for muscle spasms.    [provider]    Family History History reviewed. No pertinent family history.  Social History Social History   Tobacco Use   Smoking status: Every Day   Tobacco comments:    1 cigarette daily  Substance Use Topics   Alcohol use: No   Drug use: No     Allergies   Amoxicillin, Hydralazine, Hydrochlorothiazide, Hydrocodone, Hydrocodone-ibuprofen, Levofloxacin, Lisinopril, Metoprolol succinate, Metronidazole, Nifedipine, Omeprazole, Propranolol hcl, Tetracycline, Topiramate, and Verapamil   Review of Systems Review of Systems  Cardiovascular:  Positive for chest pain.  Gastrointestinal:  Positive for abdominal pain.  Neurological:  Positive for dizziness.  All other systems reviewed and are negative.    Physical Exam Triage Vital Signs ED Triage Vitals  Encounter Vitals Group     BP      Systolic BP Percentile      Diastolic BP Percentile      Pulse  Resp      Temp      Temp src      SpO2      Weight      Height      Head Circumference      Peak Flow      Pain Score      Pain Loc      Pain Education      Exclude from Growth Chart    No data found.  Updated Vital Signs BP (!) 147/91   Pulse 86   Temp (!) 97.4 F (36.3 C)   Resp 19   SpO2 98%    Physical Exam Vitals and nursing note reviewed.  Constitutional:      General: She is not in acute distress.    Appearance: Normal appearance. She is normal weight. She is ill-appearing.   HENT:     Head: Normocephalic and atraumatic.     Right Ear: Tympanic membrane and external ear normal.     Left Ear: Tympanic membrane and external ear normal.     Ears:     Comments: Moderate eustachian tube dysfunction noted bilaterally    Nose:     Right Turbinates: Enlarged.     Left Turbinates: Enlarged.     Left Sinus: Maxillary sinus tenderness and frontal sinus tenderness present.     Mouth/Throat:     Mouth: Mucous membranes are moist.     Pharynx: Oropharynx is clear.  Eyes:     Extraocular Movements: Extraocular movements intact.     Conjunctiva/sclera: Conjunctivae normal.     Pupils: Pupils are equal, round, and reactive to light.  Cardiovascular:     Rate and Rhythm: Normal rate and regular rhythm.     Pulses: Normal pulses.     Heart sounds: Normal heart sounds.  Pulmonary:     Effort: Pulmonary effort is normal.     Breath sounds: Normal breath sounds. No wheezing, rhonchi or rales.  Musculoskeletal:        General: Normal range of motion.     Cervical back: Normal range of motion and neck supple.  Skin:    General: Skin is warm and dry.  Neurological:     General: No focal deficit present.     Mental Status: She is alert and oriented to person, place, and time. Mental status is at baseline.  Psychiatric:        Mood and Affect: Mood normal.        Behavior: Behavior normal.      UC Treatments / Results  Labs (all labs ordered are listed, but only abnormal results are displayed) Labs Reviewed - No data to display  EKG   Radiology No results found.  Procedures Procedures (including critical care time)  Medications Ordered in UC Medications - No data to display  Initial Impression / Assessment and Plan / UC Course  I have reviewed the triage vital signs and the nursing notes.  Pertinent labs & imaging results that were available during my care of the patient were reviewed by me and considered in my medical decision making (see chart for  details).     MDM: 1.  Acute maxillary sinusitis, recurrence not specified-Rx'd cefdinir 300 mg capsule: Take 1 capsule twice daily x 7 days; 2.  Sinus pressure-Rx'd prednisone 20 mg tablet: Take 3 tablets p.o. daily x 5 days. Advised patient to take medication as directed with food to completion.  Advised patient to take prednisone with first dose  of cefdinir for the next 5 of 7 days.  Encouraged increase daily water intake to 64 ounces per day while taking these medications.  Advised if symptoms worsen and/or unresolved please follow-up with your PCP or here for further evaluation.  Patient discharged home, hemodynamically stable. Final Clinical Impressions(s) / UC Diagnoses   Final diagnoses:  Acute maxillary sinusitis, recurrence not specified  Sinus pressure     Discharge Instructions      Advised patient to take medication as directed with food to completion.  Advised patient to take prednisone with first dose of cefdinir for the next 5 of 7 days.  Encouraged increase daily water intake to 64 ounces per day while taking these medications.  Advised if symptoms worsen and/or unresolved please follow-up with your PCP or here for further evaluation.     ED Prescriptions     Medication Sig Dispense Auth. Provider   cefdinir (OMNICEF) 300 MG capsule Take 1 capsule (300 mg total) by mouth 2 (two) times daily for 7 days. 14 capsule Trevor Iha, FNP   predniSONE (DELTASONE) 20 MG tablet Take 3 tabs PO daily x 5 days. 15 tablet Trevor Iha, FNP      PDMP not reviewed this encounter.   Trevor Iha, FNP 03/23/23 1023

## 2023-03-23 NOTE — Discharge Instructions (Addendum)
 Advised patient to take medication as directed with food to completion.  Advised patient to take prednisone with first dose of cefdinir for the next 5 of 7 days.  Encouraged increase daily water intake to 64 ounces per day while taking these medications.  Advised if symptoms worsen and/or unresolved please follow-up with your PCP or here for further evaluation.
